# Patient Record
Sex: Female | Born: 1995 | Race: Black or African American | Hispanic: No | Marital: Single | Smoking: Former smoker
Health system: Southern US, Community
[De-identification: ages and names within clinical notes are randomized; demographics above are authoritative.]

## PROBLEM LIST (undated history)

## (undated) DIAGNOSIS — O24419 Gestational diabetes mellitus in pregnancy, unspecified control: Secondary | ICD-10-CM

## (undated) DIAGNOSIS — Z789 Other specified health status: Secondary | ICD-10-CM

## (undated) DIAGNOSIS — F32A Depression, unspecified: Secondary | ICD-10-CM

## (undated) DIAGNOSIS — I10 Essential (primary) hypertension: Secondary | ICD-10-CM

## (undated) HISTORY — DX: Essential (primary) hypertension: I10

## (undated) HISTORY — PX: NO PAST SURGERIES: SHX2092

## (undated) HISTORY — DX: Depression, unspecified: F32.A

---

## 2016-11-15 ENCOUNTER — Other Ambulatory Visit: Payer: Self-pay

## 2017-01-01 ENCOUNTER — Encounter (HOSPITAL_COMMUNITY): Payer: Self-pay

## 2017-01-01 LAB — US OP OB COMP + 14 WK

## 2017-01-03 ENCOUNTER — Other Ambulatory Visit (HOSPITAL_COMMUNITY): Payer: Self-pay | Admitting: Unknown Physician Specialty

## 2017-01-03 DIAGNOSIS — O283 Abnormal ultrasonic finding on antenatal screening of mother: Secondary | ICD-10-CM

## 2017-01-03 DIAGNOSIS — Z3689 Encounter for other specified antenatal screening: Secondary | ICD-10-CM

## 2017-01-03 DIAGNOSIS — Z3A18 18 weeks gestation of pregnancy: Secondary | ICD-10-CM

## 2017-01-09 ENCOUNTER — Encounter (HOSPITAL_COMMUNITY): Payer: Self-pay | Admitting: *Deleted

## 2017-01-10 ENCOUNTER — Other Ambulatory Visit (HOSPITAL_COMMUNITY): Payer: Self-pay | Admitting: Unknown Physician Specialty

## 2017-01-10 ENCOUNTER — Ambulatory Visit (HOSPITAL_COMMUNITY): Admission: RE | Admit: 2017-01-10 | Payer: Medicaid Other | Source: Ambulatory Visit

## 2017-01-10 ENCOUNTER — Ambulatory Visit (HOSPITAL_COMMUNITY)
Admission: RE | Admit: 2017-01-10 | Discharge: 2017-01-10 | Disposition: A | Discharge status: Home / Self Care | Payer: Medicaid Other | Source: Ambulatory Visit | Attending: Unknown Physician Specialty | Admitting: Unknown Physician Specialty

## 2017-01-10 ENCOUNTER — Other Ambulatory Visit (HOSPITAL_COMMUNITY): Payer: Self-pay | Admitting: *Deleted

## 2017-01-10 ENCOUNTER — Encounter (HOSPITAL_COMMUNITY): Payer: Self-pay

## 2017-01-10 DIAGNOSIS — Z3A18 18 weeks gestation of pregnancy: Secondary | ICD-10-CM

## 2017-01-10 DIAGNOSIS — Z3A19 19 weeks gestation of pregnancy: Secondary | ICD-10-CM | POA: Diagnosis not present

## 2017-01-10 DIAGNOSIS — Z363 Encounter for antenatal screening for malformations: Secondary | ICD-10-CM | POA: Diagnosis not present

## 2017-01-10 DIAGNOSIS — O350XX0 Maternal care for (suspected) central nervous system malformation in fetus, not applicable or unspecified: Secondary | ICD-10-CM | POA: Insufficient documentation

## 2017-01-10 DIAGNOSIS — O283 Abnormal ultrasonic finding on antenatal screening of mother: Secondary | ICD-10-CM

## 2017-01-10 DIAGNOSIS — Z3689 Encounter for other specified antenatal screening: Secondary | ICD-10-CM

## 2017-01-10 DIAGNOSIS — O359XX0 Maternal care for (suspected) fetal abnormality and damage, unspecified, not applicable or unspecified: Secondary | ICD-10-CM

## 2017-01-10 HISTORY — DX: Other specified health status: Z78.9

## 2017-01-10 LAB — ROUTINE CHROMOSOME - KARYOTYPE

## 2017-01-10 LAB — CHROMOSOME AFP/ACHE/HBF, AMNIOTIC FLUID

## 2017-01-13 ENCOUNTER — Other Ambulatory Visit: Payer: Self-pay

## 2017-01-13 NOTE — Progress Notes (Signed)
Genetic Counseling  Visit Summary Note  Appointment Date: 2/92018 Referred By: Alyssa Morgan  Date of Birth: July 22, 1996  Pregnancy history: G1P0 Estimated Date of Delivery: 06/03/17 Estimated Gestational Age: [redacted]w[redacted]d I met with [name] for genetic counseling because of abnormal ultrasound findings.  She was accompanied to the visit by her mother and a family friend.  In summary:  Discussed ultrasound findings in detail  Reviewed options for additional screening  NIPS  Echocardiogram  Ongoing ultrasound  Reviewed options for diagnostic testing, including risks, benefits, limitations and alternatives  Amniocentesis performed   FISH, karyotype and microarray requested  Discussed option of continuation vs. Termination of pregnancy  [name] was sent for ultrasound and consultation today regarding abnormal ultrasound findings.  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 There were limited views of the fetal face and fetal heart. We discussed these findings in detail.  Specifically, we discussed that congenital anomalies can occur as isolated, nonsyndromic birth defects, or as features of an underlying genetic syndrome.  The risk for a genetic etiology increases with the presence of multiple fetal anatomic differences.  Based on these ultrasound findings, the risk for fetal aneuploidy is estimated to be 20-30%.  We reviewed chromosomes, nondisjunction, and the common features of Down syndrome, trisomy 132 and trisomy 125  In addition, we discussed the risk for other chromosome aberrations including microdeletions (22q11 del syndrome), duplications, insertions, and translocations.  We discussed that holoprosencephaly (HPE) is a structural anomaly of the brain in which there is failed or incomplete separation of the forebrain during the third to fourth week of pregnancy.  Classic HPE encompasses a continuum of brain malformations including: alobar (no separation of the cerebral hemispheres), semilobar (left and right frontal and parietal lobes are fused and the interhemispheric fissure is only present posteriorly), and lobar (most of the cerebral hemispheres are separated but the most rostral aspect remains fused).  HPE is accompanied by a spectrum of characteristic craniofacial anomalies in approximately 80% of individuals with HPE, including cyclopia or ocular hypotelorism, bilateral cleft lip/palate, and abnormal nose or proboscis.  We discussed that most children with HPE have a variety of health problems which may include: feeding difficulties, seizures, mental retardation/developmental delay, pituitary dysfunction, short stature, sleep disorders, and aspiration pneumonia.  We discussed that the prognosis for the child depends largely upon the underlying etiology and the severity of the HPE.  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  We discussed that it is difficult to fully predict the lifespan of the child, but based on the current findings, the prognosis appears poor.  A recent publication found that approximately 50% of children with alobar HPE pass away before age f34to five months and only 20% live beyond the first year of life.  We also discussed the various causes for HPE including: an environmental (alcohol and maternal diabetes mellitus), multifactorial, or genetic etiology. Regarding genetic etiologies, we discussed that these can be further subdivided into syndromic and nonsyndromic categories.  Syndromic causes include many single gene and chromosomal aberrations.  The single gene category includes both dominant (Pallister-Hall) and recessive conditions (Meckel syndrome and SLOS), while the chromosome differences include a variety of deletions, duplications, trisomies, and triploidy.  We reviewed that the  nonsyndromic single gene causes of HPE are mostly inherited in an autosomal dominant fashion.  More than 12 genes are currently known  to cause nonsyndromic HPE.  We discussed that these gene mutations are associated with phenotypic variability.  She was counseled that the risk of recurrence depends upon the underlying etiology.     We discussed that approximately 25-50% of fetuses with HPE have a chromosome difference (specifically trisomy 28).  She was counseled regarding the option of amniocentesis  including the associated risks, benefits, and limitations.  She understands that chromosome analysis can be performed both prenatally (amniotic fluid) and postnatally (peripheral blood or cord blood).  Additionally, we discussed the availability of microarray analysis, which can also be performed pre and postnatally.  [name] was counseled that microarray analysis is a molecular based technique in which a test sample of DNA (fetal) is compared to a reference (normal) genome in order to determine if the test sample has any extra or missing genetic information.  Microarray analysis allows for the detection of genetic deletions and duplications that are 818 times smaller than those identified by routine chromosome analysis.  We discussed that recent publications show that approximately 6% of patients with an abnormal fetal ultrasound and a normal fetal karyotype had a significant microdeletion/microduplication detected by prenatal microarray analysis. She was also counseled regarding the option of noninvasive prenatal screening (NIPS).  We reviewed that this technology evaluates fragments of fetal (placental) DNA found in maternal circulation to predict the chance of specific chromosome conditions in the fetus.  Although highly specific and sensitive, this testing is not considered diagnostic.  We reviewed that NIPS specifically assesses the risk of fetal Down syndrome, trisomy 31, trisomy 74, fetal sex chromosome  aneuploidy, triploidy, and select microdeletions (22q11 deletion syndrome); currently, this technology cannot assess the risk for all chromosome aberrations or single gene conditions.   We also reviewed the availability of a fetal MRI versus postnatal MRI to help determine the severity of the HPE.  [name] elected to have amniocentesis following our discussion today, routine chromosome analysis, FISH and microarray were ordered.  We also discussed the option of continuing the pregnancy versu termination of pregnancy.  We discussed that pregnancy termination is a legal option in [place] until [redacted] weeks gestation, following a 72 hour waiting period after consents are signed.  We discussed the options of D&E and induction.  [name] was counseled that if she elects to continue the pregnancy we will be available to assist her in any way we can to gather more information regarding the etiology and identify a plan for her baby's delivery and newborn management.    Both family histories were briefly reviewed and found to be noncontributory for birth defects, mental retardation, and known genetic conditions. Without further information regarding the provided family history, an accurate genetic risk cannot be calculated. Further genetic counseling is warranted if more information is obtained.  [name] denied exposure to environmental toxins or chemical agents. She denied the use of alcohol, tobacco or street drugs. She denied significant viral illnesses during the course of her pregnancy. Her medical and surgical histories were noncontributory.   I counseled [name] regarding the above risks and available options.  The approximate face-to-face time with the genetic counselor was 65 minutes.  Cam Hai, MS,  Certified Genetic Counselor

## 2017-01-14 ENCOUNTER — Other Ambulatory Visit (HOSPITAL_COMMUNITY): Payer: Self-pay | Admitting: *Deleted

## 2017-01-14 ENCOUNTER — Telehealth (HOSPITAL_COMMUNITY): Payer: Self-pay | Admitting: Genetics

## 2017-01-14 DIAGNOSIS — Q042 Holoprosencephaly: Secondary | ICD-10-CM

## 2017-01-14 DIAGNOSIS — O283 Abnormal ultrasonic finding on antenatal screening of mother: Secondary | ICD-10-CM | POA: Insufficient documentation

## 2017-01-14 DIAGNOSIS — Z3A19 19 weeks gestation of pregnancy: Secondary | ICD-10-CM | POA: Insufficient documentation

## 2017-01-14 NOTE — Telephone Encounter (Signed)
Called Alyssa Morgan to discuss the preliminary FISH results from her amniocentesis. Patient verified by name and date of birth. We reviewed that these are within normal limits.  We again discussed the limitations of FISH and that final results are still pending and will be available in 1-2 weeks.  Patient reported that she has not noticed any concerning symptoms or complications following the procedure.  All questions were answered to her satisfaction, she was encouraged to call with additional questions or concerns.  Ms. Lattie CornsMcNebb then put her mother on the phone.  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  She was wanting to know when they needed to sign the paperwork as they were going to their physician's office in [place] today to sign a release to send the referral.  We discussed that Alyssa would need to speak with a physician regarding the 72 hour papers and that I would discuss with Dr. Claudean SeveranceWhitecar and Dr. Otho PerlNitsche and we would contact her.  Mady Gemmaaragh Meklit Cotta, MS Certified Genetic Counselor

## 2017-01-16 ENCOUNTER — Other Ambulatory Visit: Payer: Self-pay

## 2017-01-20 ENCOUNTER — Other Ambulatory Visit: Payer: Self-pay

## 2017-01-27 ENCOUNTER — Other Ambulatory Visit: Payer: Self-pay

## 2017-02-06 ENCOUNTER — Encounter (HOSPITAL_COMMUNITY): Payer: Self-pay | Admitting: *Deleted

## 2017-02-07 ENCOUNTER — Ambulatory Visit (HOSPITAL_COMMUNITY)
Admission: RE | Admit: 2017-02-07 | Discharge: 2017-02-07 | Disposition: A | Discharge status: Home / Self Care | Payer: Medicaid Other | Source: Ambulatory Visit | Attending: Unknown Physician Specialty | Admitting: Unknown Physician Specialty

## 2018-02-19 ENCOUNTER — Other Ambulatory Visit (HOSPITAL_COMMUNITY): Payer: Self-pay | Admitting: Unknown Physician Specialty

## 2018-02-19 DIAGNOSIS — Z3689 Encounter for other specified antenatal screening: Secondary | ICD-10-CM

## 2018-02-24 ENCOUNTER — Encounter (HOSPITAL_COMMUNITY): Payer: Self-pay | Admitting: *Deleted

## 2018-02-25 ENCOUNTER — Ambulatory Visit (HOSPITAL_COMMUNITY)
Admission: RE | Admit: 2018-02-25 | Discharge: 2018-02-25 | Disposition: A | Discharge status: Home / Self Care | Payer: Medicaid Other | Source: Ambulatory Visit | Attending: Unknown Physician Specialty | Admitting: Unknown Physician Specialty

## 2018-02-25 ENCOUNTER — Encounter (HOSPITAL_COMMUNITY): Payer: Self-pay

## 2018-02-25 ENCOUNTER — Other Ambulatory Visit (HOSPITAL_COMMUNITY): Payer: Self-pay | Admitting: Unknown Physician Specialty

## 2018-02-25 DIAGNOSIS — O09892 Supervision of other high risk pregnancies, second trimester: Secondary | ICD-10-CM | POA: Insufficient documentation

## 2018-02-25 DIAGNOSIS — O352XX0 Maternal care for (suspected) hereditary disease in fetus, not applicable or unspecified: Secondary | ICD-10-CM | POA: Insufficient documentation

## 2018-02-25 DIAGNOSIS — Z3689 Encounter for other specified antenatal screening: Secondary | ICD-10-CM | POA: Insufficient documentation

## 2018-02-25 DIAGNOSIS — E669 Obesity, unspecified: Secondary | ICD-10-CM | POA: Insufficient documentation

## 2018-02-25 DIAGNOSIS — Z8279 Family history of other congenital malformations, deformations and chromosomal abnormalities: Secondary | ICD-10-CM | POA: Insufficient documentation

## 2018-02-25 DIAGNOSIS — Z3A18 18 weeks gestation of pregnancy: Secondary | ICD-10-CM | POA: Insufficient documentation

## 2018-02-25 DIAGNOSIS — O99212 Obesity complicating pregnancy, second trimester: Secondary | ICD-10-CM | POA: Insufficient documentation

## 2018-02-26 ENCOUNTER — Other Ambulatory Visit (HOSPITAL_COMMUNITY): Payer: Self-pay | Admitting: *Deleted

## 2018-02-26 ENCOUNTER — Encounter (HOSPITAL_COMMUNITY): Payer: Self-pay

## 2018-02-26 DIAGNOSIS — Z362 Encounter for other antenatal screening follow-up: Secondary | ICD-10-CM

## 2018-03-25 ENCOUNTER — Encounter (HOSPITAL_COMMUNITY): Payer: Self-pay

## 2018-03-25 ENCOUNTER — Other Ambulatory Visit (HOSPITAL_COMMUNITY): Payer: Self-pay | Admitting: Obstetrics and Gynecology

## 2018-03-25 ENCOUNTER — Ambulatory Visit (HOSPITAL_COMMUNITY)
Admission: RE | Admit: 2018-03-25 | Discharge: 2018-03-25 | Disposition: A | Discharge status: Home / Self Care | Payer: Medicaid Other | Source: Ambulatory Visit | Attending: Unknown Physician Specialty | Admitting: Unknown Physician Specialty

## 2018-03-25 DIAGNOSIS — Z362 Encounter for other antenatal screening follow-up: Secondary | ICD-10-CM | POA: Insufficient documentation

## 2018-03-25 DIAGNOSIS — Z3A22 22 weeks gestation of pregnancy: Secondary | ICD-10-CM

## 2018-03-25 DIAGNOSIS — O99212 Obesity complicating pregnancy, second trimester: Secondary | ICD-10-CM | POA: Insufficient documentation

## 2018-03-25 DIAGNOSIS — O99210 Obesity complicating pregnancy, unspecified trimester: Secondary | ICD-10-CM | POA: Insufficient documentation

## 2018-03-25 DIAGNOSIS — O09892 Supervision of other high risk pregnancies, second trimester: Secondary | ICD-10-CM

## 2018-03-26 ENCOUNTER — Other Ambulatory Visit (HOSPITAL_COMMUNITY): Payer: Self-pay | Admitting: *Deleted

## 2018-03-26 DIAGNOSIS — O99210 Obesity complicating pregnancy, unspecified trimester: Secondary | ICD-10-CM

## 2018-05-06 ENCOUNTER — Encounter (HOSPITAL_COMMUNITY): Payer: Self-pay

## 2018-05-06 ENCOUNTER — Ambulatory Visit (HOSPITAL_COMMUNITY)
Admission: RE | Admit: 2018-05-06 | Discharge: 2018-05-06 | Disposition: A | Discharge status: Home / Self Care | Payer: Medicaid Other | Source: Ambulatory Visit | Attending: Unknown Physician Specialty | Admitting: Unknown Physician Specialty

## 2018-05-06 DIAGNOSIS — O99213 Obesity complicating pregnancy, third trimester: Secondary | ICD-10-CM | POA: Insufficient documentation

## 2018-05-06 DIAGNOSIS — Z362 Encounter for other antenatal screening follow-up: Secondary | ICD-10-CM | POA: Insufficient documentation

## 2018-05-06 DIAGNOSIS — Z8489 Family history of other specified conditions: Secondary | ICD-10-CM | POA: Diagnosis not present

## 2018-05-06 DIAGNOSIS — O352XX0 Maternal care for (suspected) hereditary disease in fetus, not applicable or unspecified: Secondary | ICD-10-CM | POA: Insufficient documentation

## 2018-05-06 DIAGNOSIS — Z3A28 28 weeks gestation of pregnancy: Secondary | ICD-10-CM | POA: Diagnosis present

## 2018-05-06 DIAGNOSIS — E669 Obesity, unspecified: Secondary | ICD-10-CM | POA: Diagnosis not present

## 2018-05-06 DIAGNOSIS — O09893 Supervision of other high risk pregnancies, third trimester: Secondary | ICD-10-CM | POA: Diagnosis not present

## 2018-05-06 DIAGNOSIS — O99210 Obesity complicating pregnancy, unspecified trimester: Secondary | ICD-10-CM

## 2018-05-23 IMAGING — US US MFM OB DETAIL+14 WK
1 series · 13 of 28 positions shown · non-contrast
Comparison: none

[Series 1: us mfm ob detail+14 wk · 72 acquisitions, 13 frames shown]
[im 3/72]
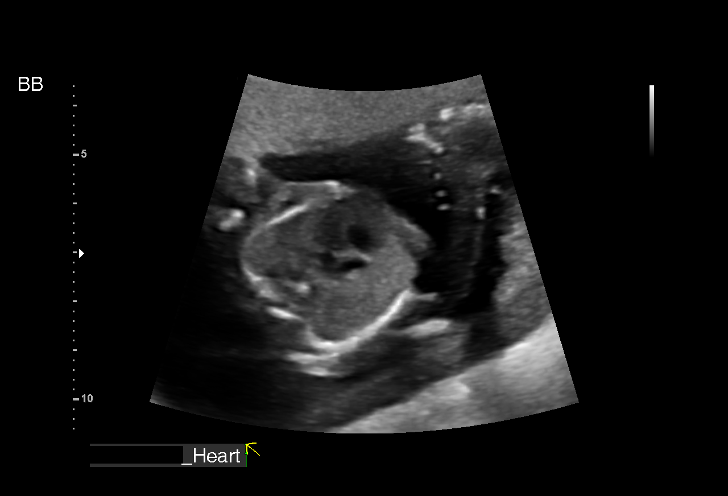
[im 8/72]
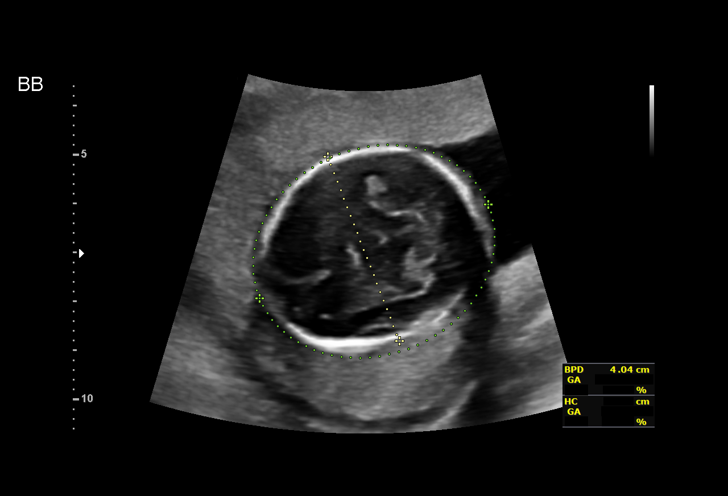
[im 14/72]
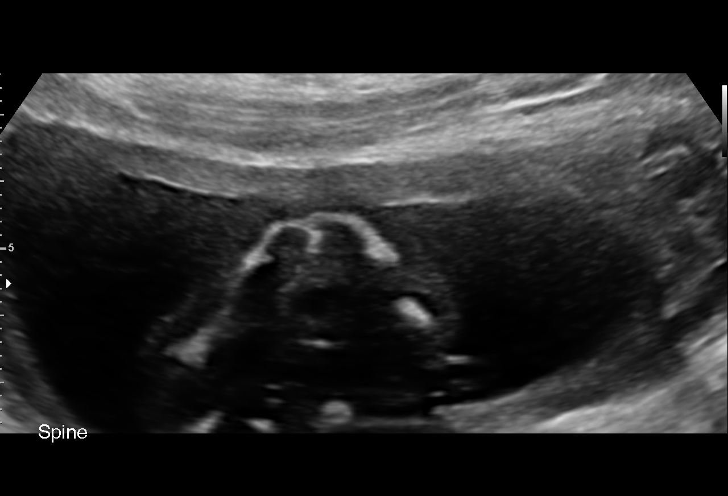
[im 19/72]
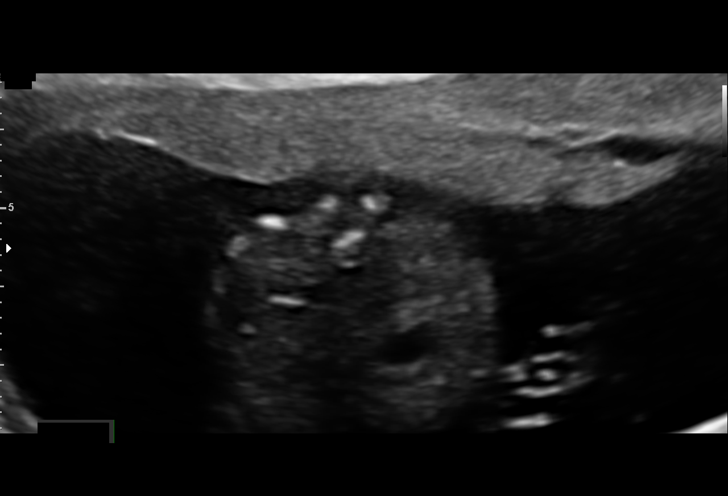
[im 24/72]
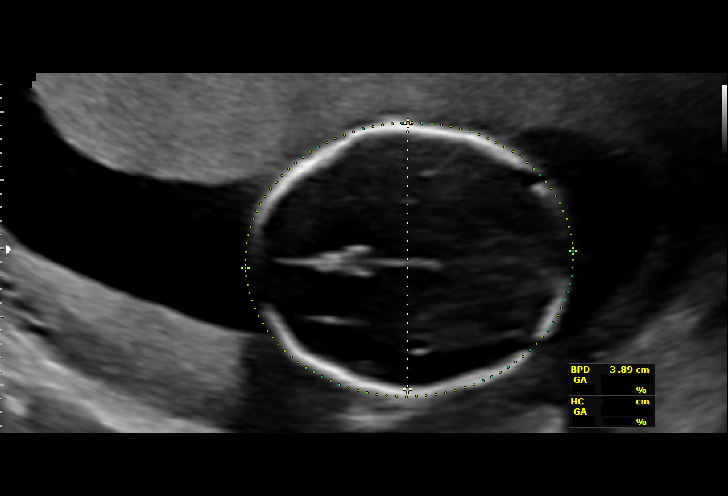
[im 29/72]
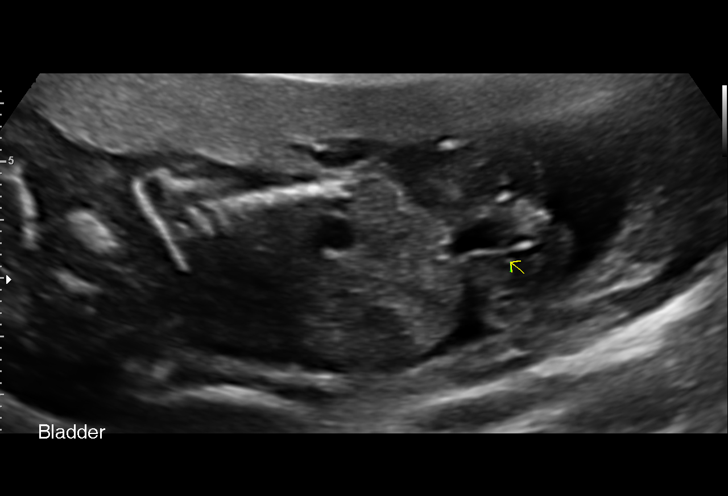
[im 37/72]
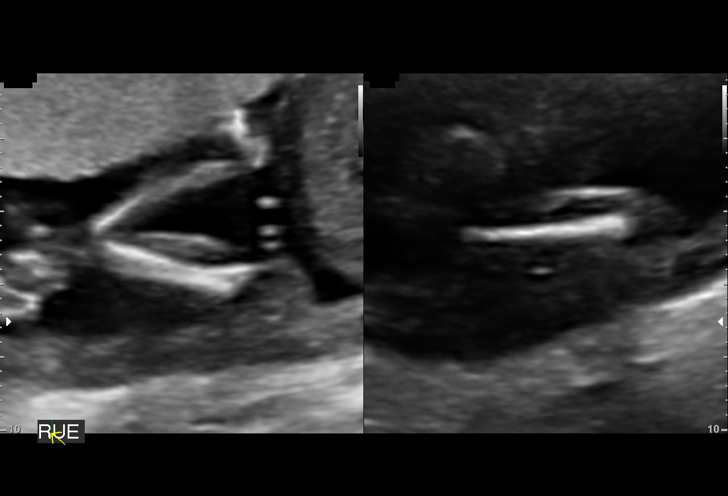
[im 43/72]
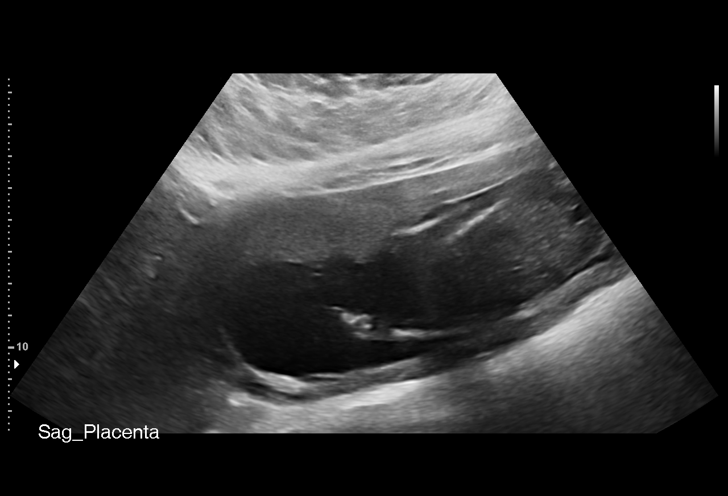
[im 48/72]
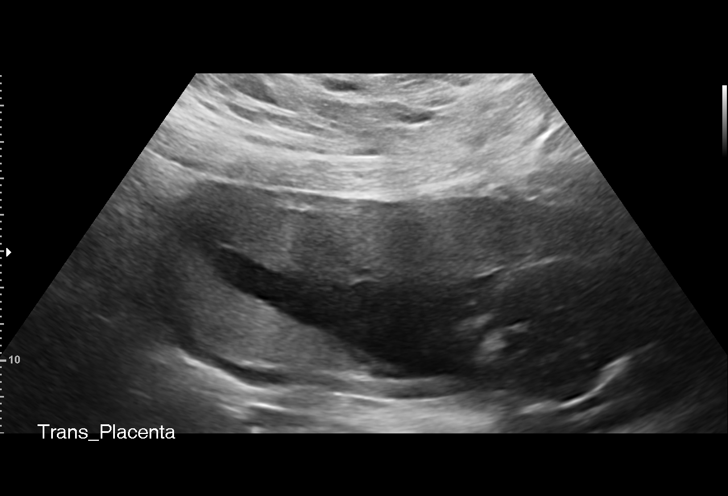
[im 53/72]
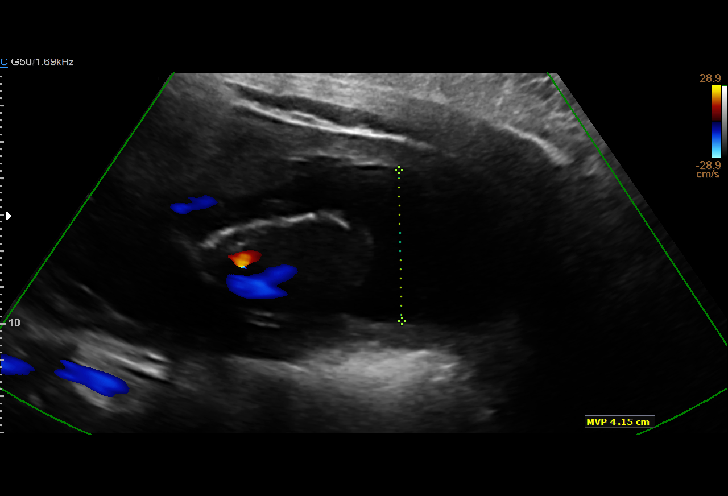
[im 58/72]
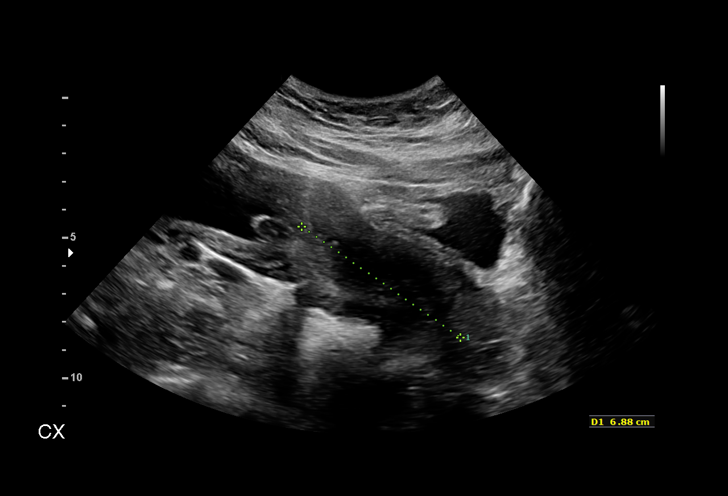
[im 64/72]
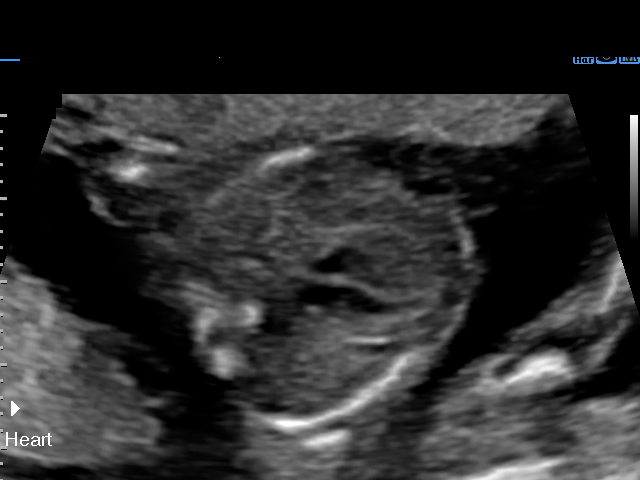
[im 69/72]
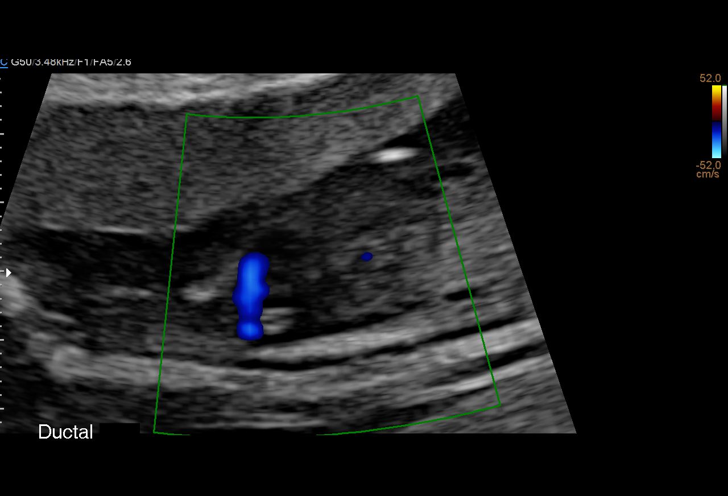

[13 of 28 positions shown; findings below may reference images not displayed]

Cent[facility]
[address],

[address]  PAULO RUI BOA              [id number]      [id number]     [id number]
Indications

18 weeks gestation of pregnancy
Encounter for fetal anatomic survey
Previous child with congenital anomaly,
antepartum (semi-lobar holoprosencephaly
Obesity complicating pregnancy, second
trimester
Short interval between pregancies, 2nd
trimester
OB History

Blood Type:            Height:         Weight (lb):  264       BMI:
Gravidity:    2         Prem:   0         SAB:   0
TOP:          1       Ectopic:  0        Living: 0
Fetal Evaluation

Num Of Fetuses:     1
Fetal Heart         148
Rate(bpm):
Cardiac Activity:   Observed
Presentation:       Variable
Placenta:           Right lateral, above cervical os
P. Cord Insertion:  Visualized

Amniotic Fluid
AFI FV:      Subjectively within normal limits

Largest Pocket(cm)
4.15
Biometry

BPD:      40.4  mm     G. Age:  18w 2d         63  %    CI:        77.38   %    70 - 86
FL/HC:      15.3   %    15.8 - 18
HC:      145.4  mm     G. Age:  17w 5d         29  %    HC/AC:      1.15        1.07 -
AC:      126.4  mm     G. Age:  18w 2d         55  %    FL/BPD:     55.2   %
FL:       22.3  mm     G. Age:  16w 5d          8  %    FL/AC:      17.6   %    20 - 24

Est. FW:     199  gm      0 lb 7 oz     39  %
Gestational Age

LMP:           18w 6d        Date:  10/16/17                 EDD:   07/23/18
U/S Today:     17w 5d                                        EDD:   07/31/18
Best:          18w 0d     Det. By:  Previous Ultrasound      EDD:   07/29/18
(02/18/18)
Anatomy

Cranium:               Appears normal         Aortic Arch:            Not well visualized
Cavum:                 Appears normal         Ductal Arch:            Appears normal
Ventricles:            Appears normal         Diaphragm:              Appears normal
Choroid Plexus:        Appears normal         Stomach:                Appears normal, left
sided
Cerebellum:            Appears normal         Abdomen:                Appears normal
Posterior Fossa:       Appears normal         Abdominal Wall:         Appears nml (cord
insert, abd wall)
Nuchal Fold:           Appears normal         Cord Vessels:           Appears normal (3
vessel cord)
Face:                  Orbits nl; profile not Kidneys:                Appear normal
well visualized
Lips:                  Appears normal         Bladder:                Appears normal
Thoracic:              Appears normal         Spine:                  Appears normal
Heart:                 Appears normal         Upper Extremities:      Appears normal
(4CH, axis, and
situs)
RVOT:                  Not well visualized    Lower Extremities:      Appears normal
LVOT:                  Appears normal

Other:  Fetus appears to be a female. Nasal bone visualized. Heels
visualized. Technically difficult due to maternal habitus and fetal
position.
Cervix Uterus Adnexa

Cervix
Normal appearance by transabdominal scan.

Uterus
No abnormality visualized.

Left Ovary
Not visualized.

Right Ovary
Not visualized.

Adnexa:       No abnormality visualized.
Impression

Singleton intrauterine pregnancy at 18+0 weeks with a
previous child with holoprosencephaly here for anatomic
survey
Review of the anatomy shows no sonographic markers for
aneuploidy or structural anomalies
However, views of the fetal heart should be considered
suboptimal secondary to maternal body habitus and fetal
position
Amniotic fluid volume is normal
Estimated fetal weight shows growth in the 39th percentile
Recommendations

Recommend follow-up ultrasound examination in 4 weeks for
completion of the anatomic survey
Given the normal findings today and the complete genetic
workup performed in the last pregnancy (including
microarray), the patient does not really need repeat genetic
counseling. This wasdiscussed with the patient and she
agreed to forgo genetic counseling today

## 2018-07-22 DIAGNOSIS — O24419 Gestational diabetes mellitus in pregnancy, unspecified control: Secondary | ICD-10-CM

## 2018-08-18 IMAGING — US US MFM OB FOLLOW-UP
1 series · 14 of 28 positions shown · non-contrast
Comparison: none

[Series 1: us mfm ob follow-up · 39 acquisitions, 14 frames shown]
[im 2/39]
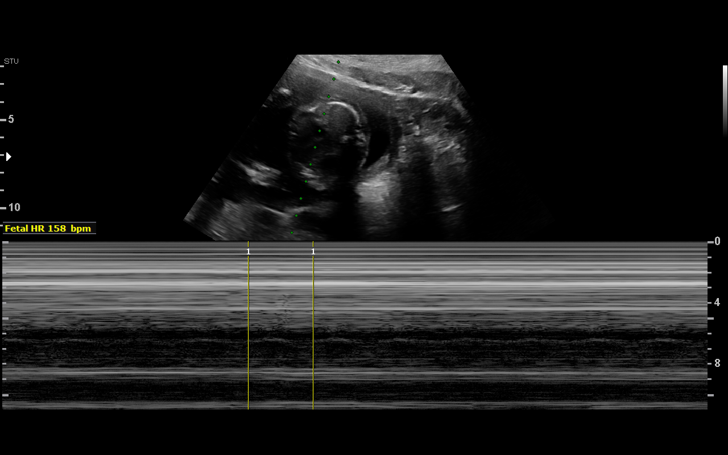
[im 5/39]
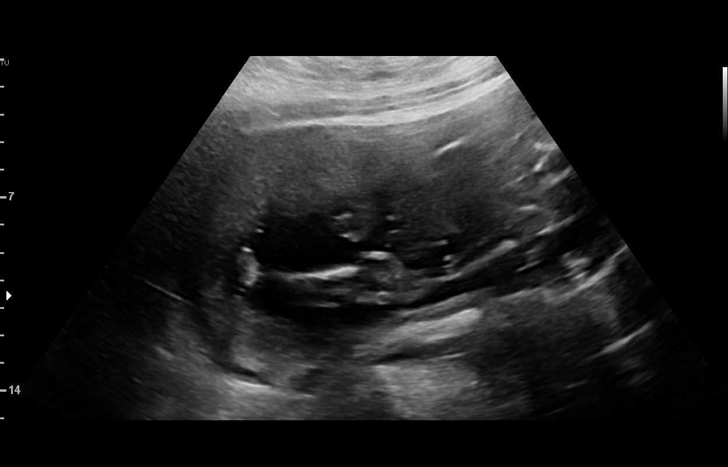
[im 8/39]
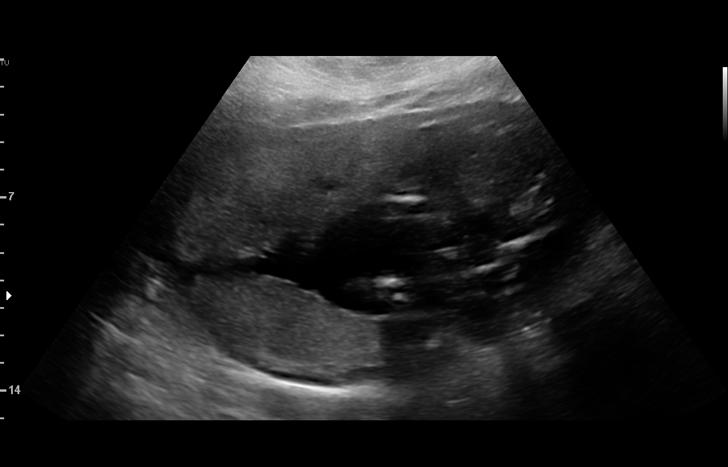
[im 10/39]
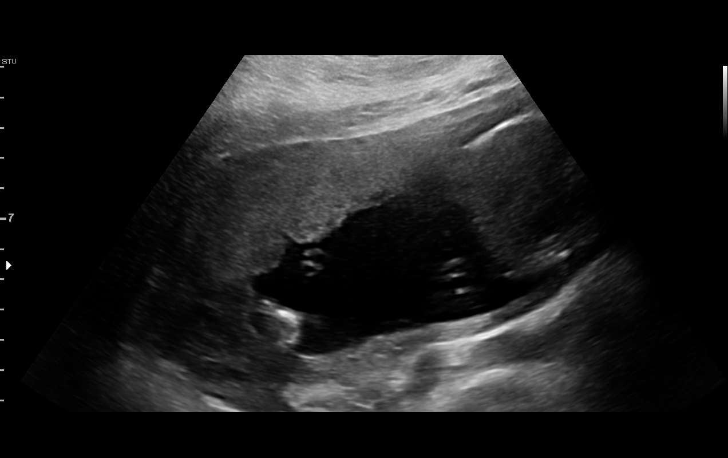
[im 13/39]
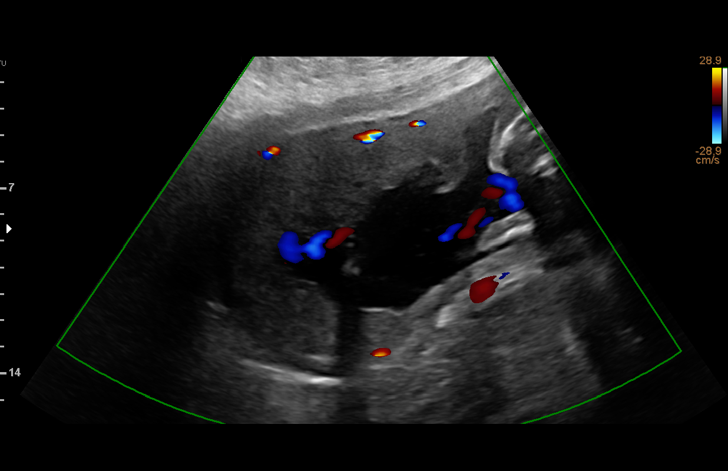
[im 16/39]
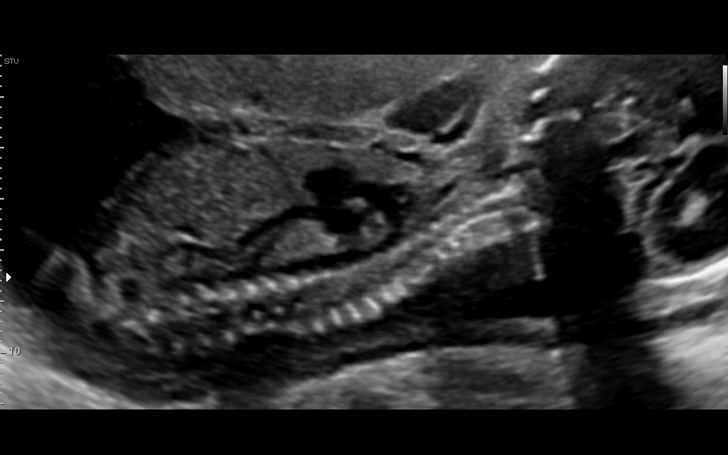
[im 19/39]
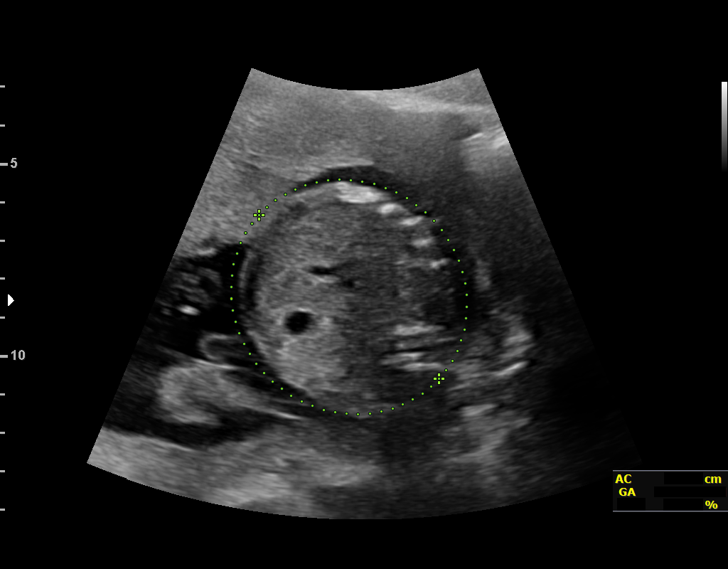
[im 22/39]
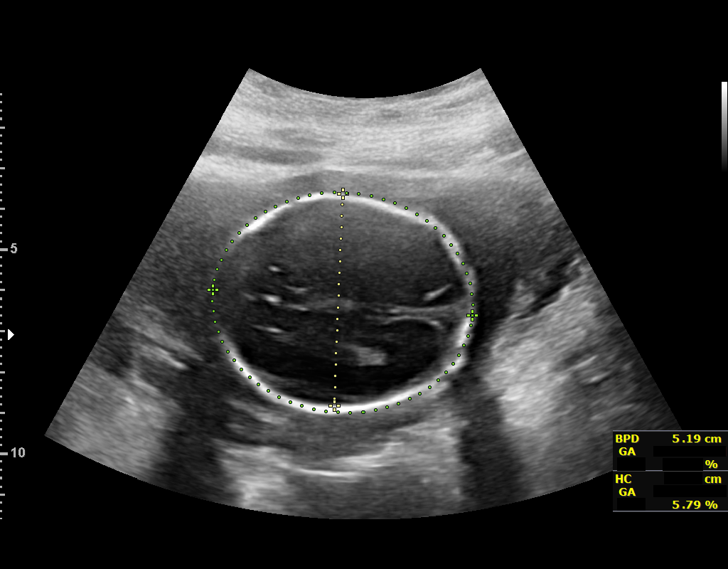
[im 24/39]
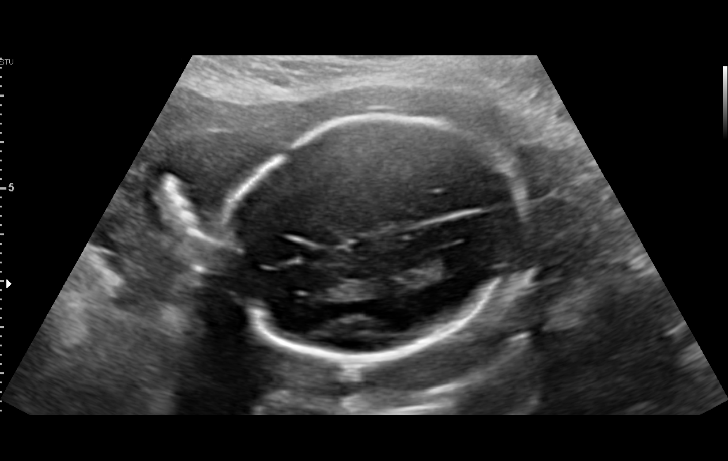
[im 27/39]
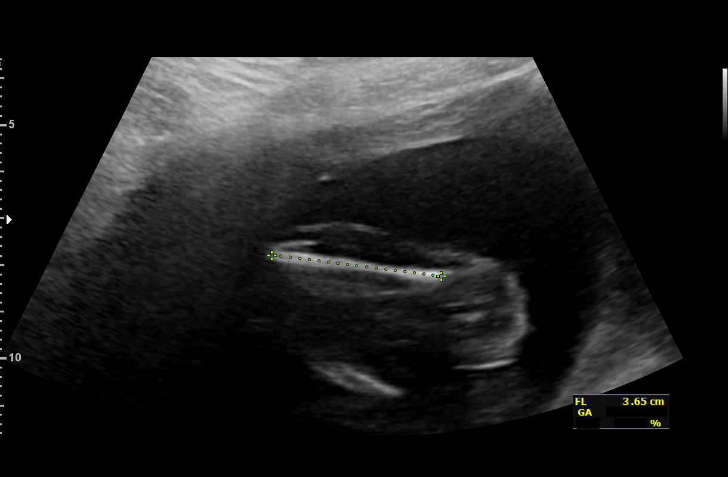
[im 30/39]
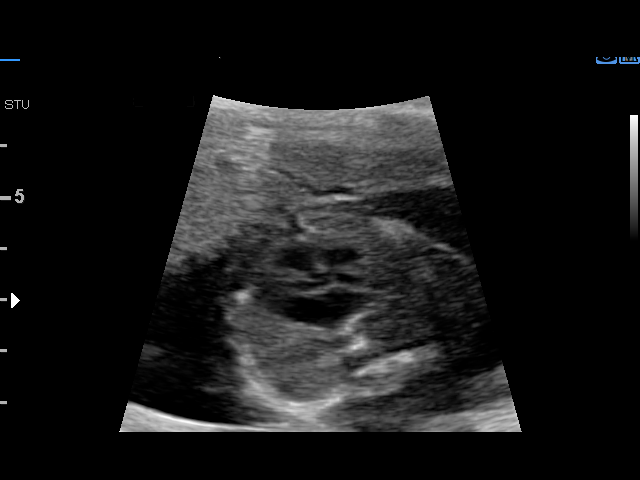
[im 33/39]
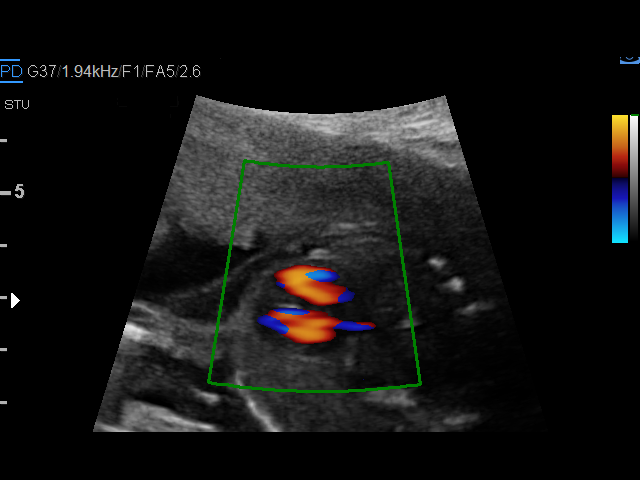
[im 36/39]
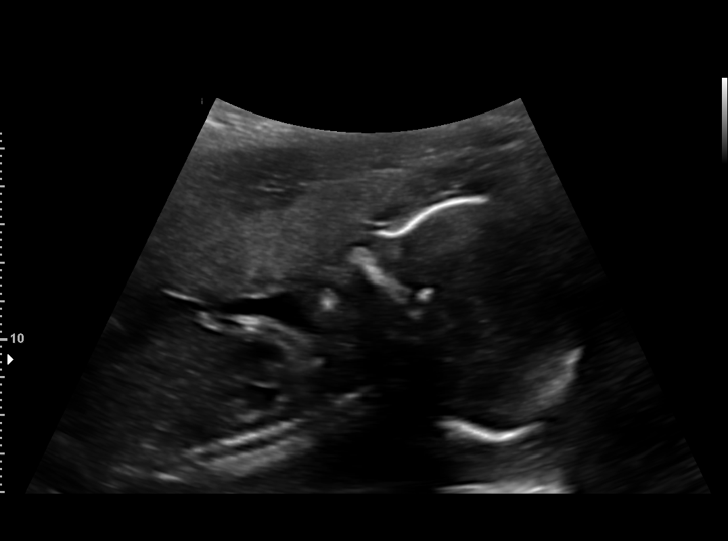
[im 39/39]
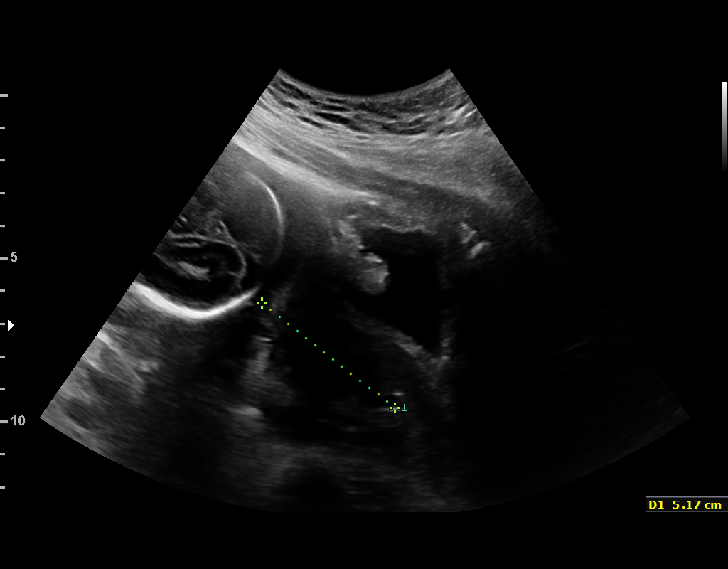

[14 of 28 positions shown; findings below may reference images not displayed]

[facility]
[address],

1  SUGIYONO ASRAFUL              [id number]      [phone]     [phone]
Indications

22 weeks gestation of pregnancy
Previous child with congenital anomaly,
antepartum (semi-lobar holoprosencephaly
Obesity complicating pregnancy, second
trimester
Short interval between pregancies, 2nd
trimester
OB History

Blood Type:            Height:         Weight (lb):  264       BMI:
Gravidity:    2         Prem:   0         SAB:   0
TOP:          1       Ectopic:  0        Living: 0
Fetal Evaluation

Num Of Fetuses:     1
Fetal Heart         158
Rate(bpm):
Cardiac Activity:   Observed
Presentation:       Cephalic
Placenta:           Right lateral, above cervical os
P. Cord Insertion:  Previously Visualized

Amniotic Fluid
AFI FV:      Subjectively within normal limits

Largest Pocket(cm)
5.07
Biometry

BPD:      52.2  mm     G. Age:  21w 6d         41  %    CI:        79.61   %    70 - 86
FL/HC:      19.4   %    18.4 -
HC:      184.9  mm     G. Age:  20w 6d          6  %    HC/AC:      0.97        1.06 -
AC:      191.5  mm     G. Age:  23w 6d         91  %    FL/BPD:     68.8   %    71 - 87
FL:       35.9  mm     G. Age:  21w 3d         21  %    FL/AC:      18.7   %    20 - 24
HUM:      35.6  mm     G. Age:  22w 3d         57  %

Est. FW:     513  gm      1 lb 2 oz     56  %
Gestational Age

LMP:           22w 6d        Date:  10/16/17                 EDD:   07/23/18
U/S Today:     22w 0d                                        EDD:   07/29/18
Best:          22w 0d     Det. By:  Previous Ultrasound      EDD:   07/29/18
(02/18/18)
Anatomy

Cranium:               Appears normal         Aortic Arch:            Appears normal
Cavum:                 Appears normal         Ductal Arch:            Previously seen
Ventricles:            Appears normal         Diaphragm:              Appears normal
Choroid Plexus:        Previously seen        Stomach:                Appears normal, left
sided
Cerebellum:            Previously seen        Abdomen:                Appears normal
Posterior Fossa:       Previously seen        Abdominal Wall:         Previously seen
Nuchal Fold:           Previously seen        Cord Vessels:           Previously seen
Face:                  Profile nl; orbits     Kidneys:                Previously seen
prev seen
Lips:                  Previously seen        Bladder:                Appears normal
Thoracic:              Appears normal         Spine:                  Previously seen
Heart:                 Previously seen        Upper Extremities:      Previously seen
RVOT:                  Not well visualized    Lower Extremities:      Previously seen
LVOT:                  Previously seen

Other:  Fetus appears to be a female. Nasal bone visualized. Heels
visualized. Technically difficult due to maternal habitus and fetal
position.
Cervix Uterus Adnexa

Cervix
Length:           5.17  cm.
Normal appearance by transabdominal scan.
Impression

Single living intrauterine pregnancy at 22w 0d.
Cephalic presentation.
Placenta Right lateral, above cervical os.
Normal amniotic fluid volume.
Appropriate interval fetal growth.
Normal interval fetal anatomy.
Survey remains incomplete as above (RVOT is suboptimal)
Recommendations

Recommend follow up attempt to complete survey in 6 weeks.

## 2018-09-29 IMAGING — US US MFM OB FOLLOW-UP
1 series · 14 of 26 positions shown · non-contrast
Comparison: none

[Series 1: us mfm ob follow-up · 26 acquisitions, 14 frames shown]
[im 1/26]
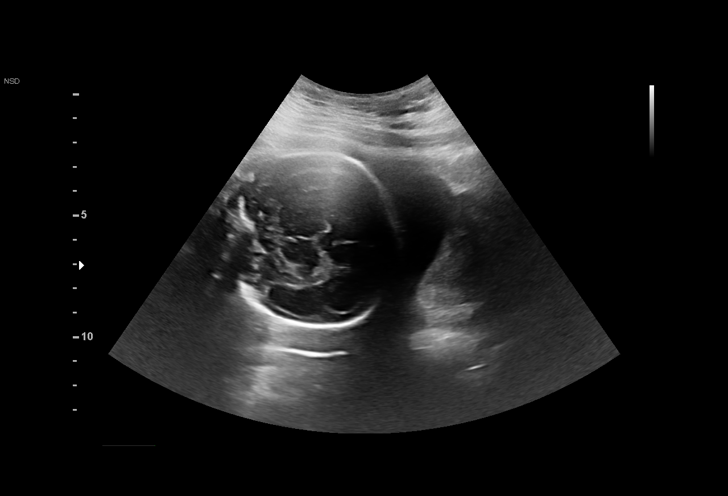
[im 3/26]
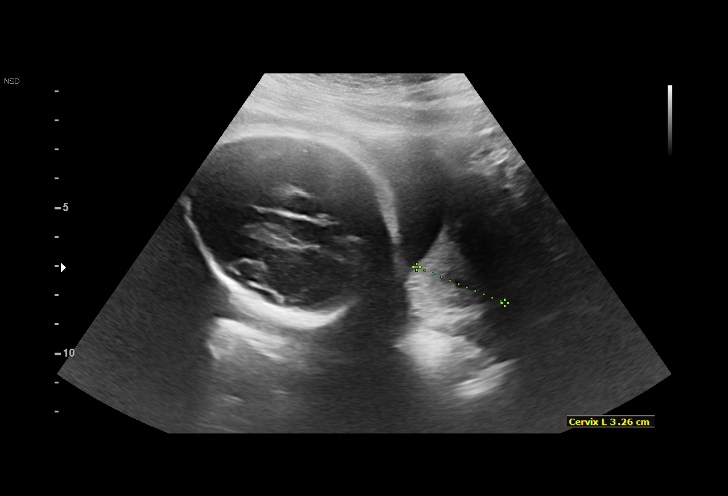
[im 5/26]
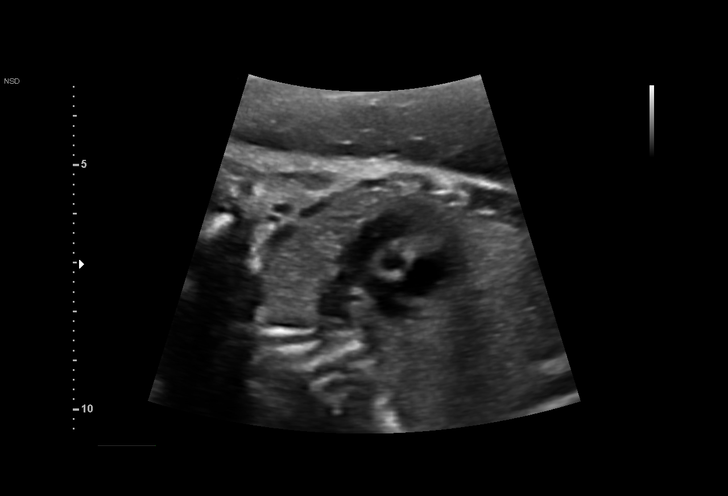
[im 7/26]
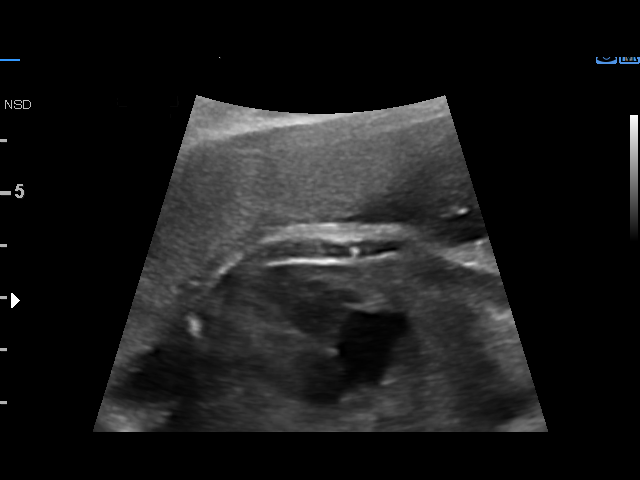
[im 9/26]
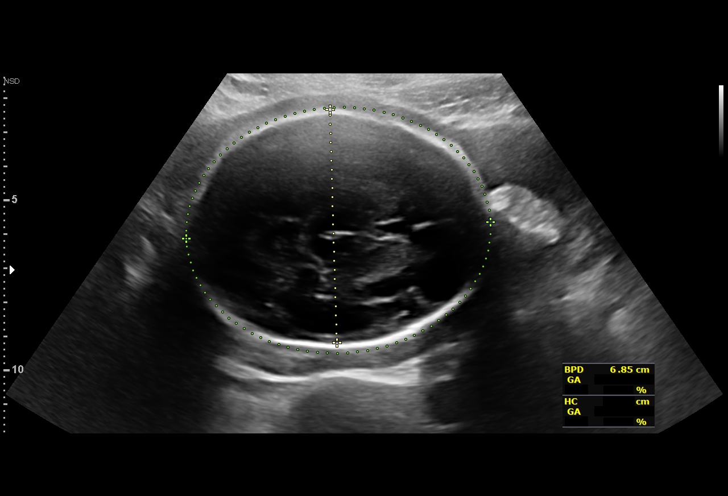
[im 11/26]
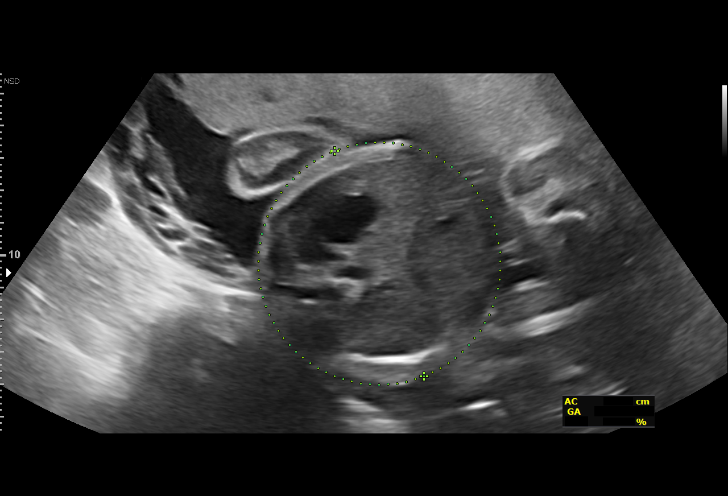
[im 13/26]
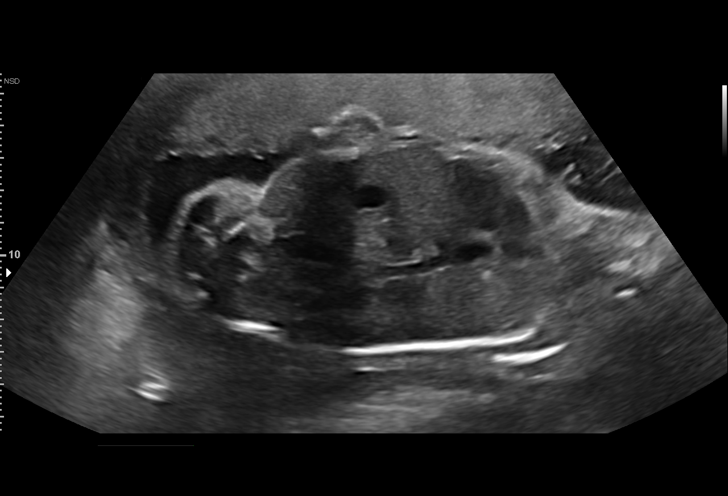
[im 14/26]
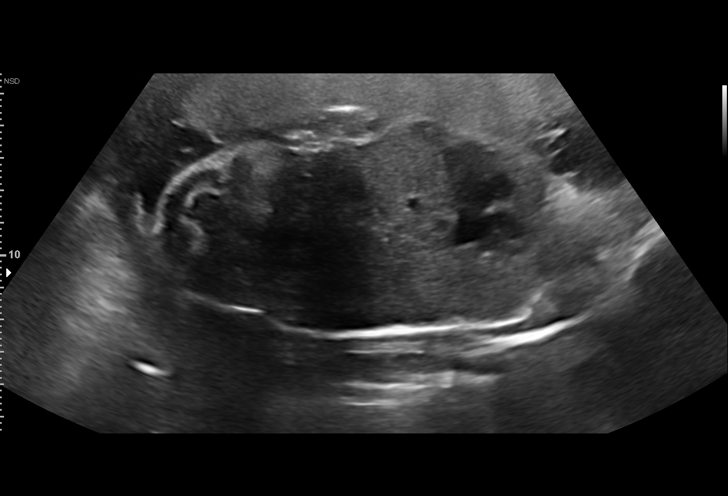
[im 16/26]
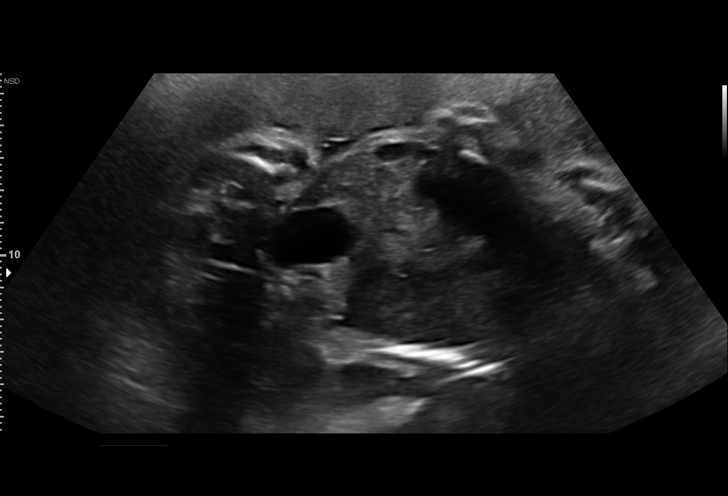
[im 18/26]
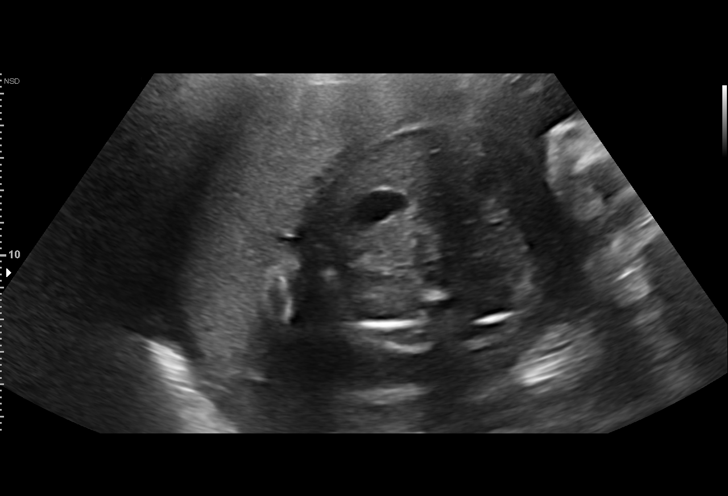
[im 20/26]
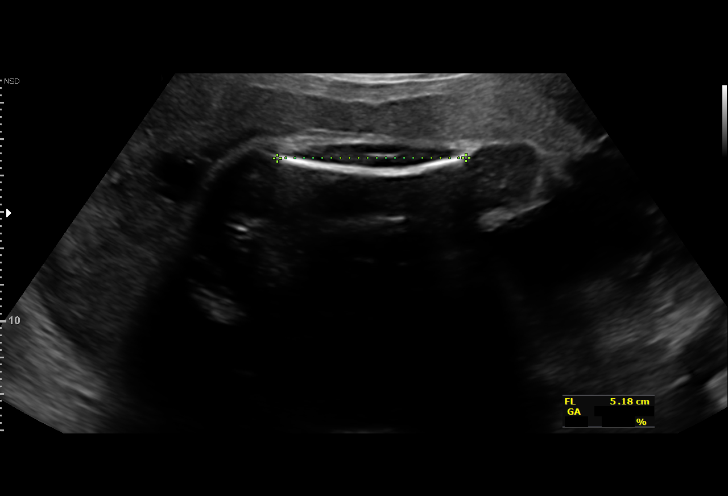
[im 22/26]
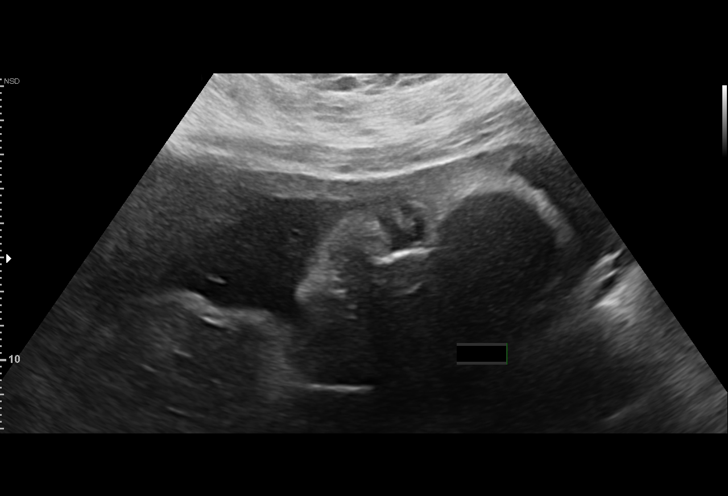
[im 24/26]
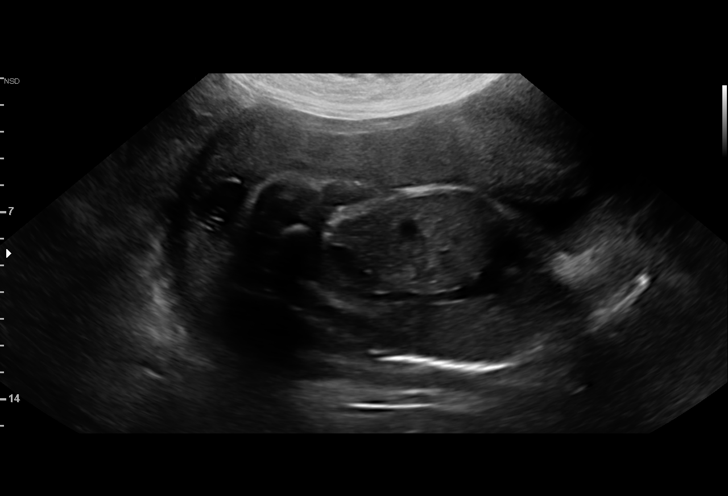
[im 26/26]
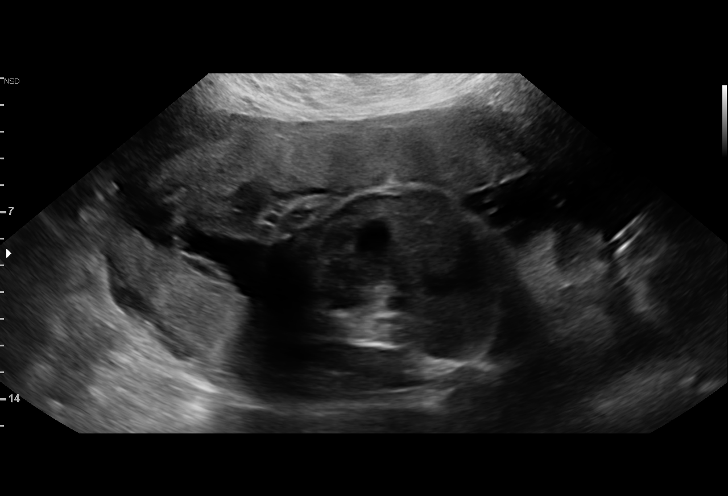

[14 of 26 positions shown; findings below may reference images not displayed]

Center
[REDACTED] [id number]

Indications

28 weeks gestation of pregnancy
Previous child with congenital anomaly,
antepartum (semi-lobar holoprosencephaly
Obesity complicating pregnancy, second
trimester
Short interval between pregancies, 2nd
trimester
Antenatal follow-up for nonvisualized fetal
anatomy
OB History

Blood Type:            Height:         Weight (lb):  264       BMI:
Gravidity:    2         Prem:   0         SAB:   0
TOP:          1       Ectopic:  0        Living: 0
Fetal Evaluation

Num Of Fetuses:     1
Fetal Heart         145
Rate(bpm):
Cardiac Activity:   Observed
Presentation:       Cephalic
Placenta:           Right lateral, above cervical os
P. Cord Insertion:  Previously Visualized

Amniotic Fluid
AFI FV:      Subjectively within normal limits

AFI Sum(cm)     %Tile       Largest Pocket(cm)
14.67           50
RUQ(cm)       RLQ(cm)       LUQ(cm)        LLQ(cm)
3.1
Biometry

BPD:      69.5  mm     G. Age:  27w 6d         36  %    CI:        74.74   %    70 - 86
FL/HC:      20.3   %    18.8 -
HC:      255.1  mm     G. Age:  27w 5d         15  %    HC/AC:      1.09        1.05 -
AC:      233.5  mm     G. Age:  27w 5d         33  %    FL/BPD:     74.4   %    71 - 87
FL:       51.7  mm     G. Age:  27w 4d         24  %    FL/AC:      22.1   %    20 - 24

Est. FW:    6666  gm      2 lb 7 oz     44  %
Gestational Age

LMP:           28w 6d        Date:  10/16/17                 EDD:   07/23/18
U/S Today:     27w 5d                                        EDD:   07/31/18
Best:          28w 0d     Det. By:  Previous Ultrasound      EDD:   07/29/18
(02/18/18)
Anatomy

Cranium:               Appears normal         Aortic Arch:            Previously seen
Cavum:                 Previously seen        Ductal Arch:            Previously seen
Ventricles:            Appears normal         Diaphragm:              Appears normal
Choroid Plexus:        Previously seen        Stomach:                Appears normal, left
sided
Cerebellum:            Previously seen        Abdomen:                Appears normal
Posterior Fossa:       Previously seen        Abdominal Wall:         Previously seen
Nuchal Fold:           Previously seen        Cord Vessels:           Previously seen
Face:                  Orbits and profile     Kidneys:                Appear normal
previously seen
Lips:                  Previously seen        Bladder:                Appears normal
Thoracic:              Appears normal         Spine:                  Previously seen
Heart:                 Appears normal         Upper Extremities:      Previously seen
(4CH, axis, and situs
RVOT:                  Appears normal         Lower Extremities:      Previously seen
LVOT:                  Previously seen

Other:  Female gender previously seen. Nasal bone and Heels previously
visualized.
Cervix Uterus Adnexa

Cervix
Length:           3.26  cm.
Normal appearance by transabdominal scan.
Impression

Single living intrauterine pregnancy at 28w 0d.
Cephalic presentation.
Placenta Right lateral, above cervical os.
Appropriate fetal growth.
Normal amniotic fluid volume.
The fetal anatomic survey is complete.
Normal fetal anatomy.
No fetal anomalies or soft markers of aneuploidy seen.
The cervix measures 3.26cm on transabdominal imaging
without funneling.
Recommendations

Follow-up ultrasounds as clinically indicated.

## 2018-10-21 ENCOUNTER — Encounter (HOSPITAL_COMMUNITY): Payer: Self-pay

## 2021-08-01 LAB — CYTOLOGY - PAP

## 2021-09-24 DIAGNOSIS — F329 Major depressive disorder, single episode, unspecified: Secondary | ICD-10-CM | POA: Insufficient documentation

## 2021-09-24 DIAGNOSIS — F1020 Alcohol dependence, uncomplicated: Secondary | ICD-10-CM | POA: Insufficient documentation

## 2023-03-31 ENCOUNTER — Encounter (HOSPITAL_COMMUNITY): Payer: Self-pay

## 2023-03-31 ENCOUNTER — Emergency Department (HOSPITAL_COMMUNITY)
Admission: EM | Admit: 2023-03-31 | Discharge: 2023-04-01 | Disposition: A | Discharge status: Home / Self Care | Payer: Medicaid Other | Attending: Emergency Medicine | Admitting: Emergency Medicine

## 2023-03-31 ENCOUNTER — Other Ambulatory Visit: Payer: Self-pay

## 2023-03-31 ENCOUNTER — Emergency Department (HOSPITAL_COMMUNITY): Payer: Medicaid Other

## 2023-03-31 DIAGNOSIS — S62614A Displaced fracture of proximal phalanx of right ring finger, initial encounter for closed fracture: Secondary | ICD-10-CM | POA: Insufficient documentation

## 2023-03-31 DIAGNOSIS — W1830XA Fall on same level, unspecified, initial encounter: Secondary | ICD-10-CM | POA: Diagnosis not present

## 2023-03-31 DIAGNOSIS — R55 Syncope and collapse: Secondary | ICD-10-CM | POA: Diagnosis not present

## 2023-03-31 DIAGNOSIS — S65504A Unspecified injury of blood vessel of right ring finger, initial encounter: Secondary | ICD-10-CM | POA: Diagnosis present

## 2023-03-31 LAB — CBC
HCT: 36.2 % (ref 36.0–46.0)
Hemoglobin: 12.1 g/dL (ref 12.0–15.0)
MCH: 31.9 pg (ref 26.0–34.0)
MCHC: 33.4 g/dL (ref 30.0–36.0)
MCV: 95.5 fL (ref 80.0–100.0)
Platelets: 228 10*3/uL (ref 150–400)
RBC: 3.79 MIL/uL — ABNORMAL LOW (ref 3.87–5.11)
RDW: 13.3 % (ref 11.5–15.5)
WBC: 9.4 10*3/uL (ref 4.0–10.5)
nRBC: 0 % (ref 0.0–0.2)

## 2023-03-31 LAB — BASIC METABOLIC PANEL
Anion gap: 10 (ref 5–15)
BUN: 7 mg/dL (ref 6–20)
CO2: 19 mmol/L — ABNORMAL LOW (ref 22–32)
Calcium: 9 mg/dL (ref 8.9–10.3)
Chloride: 105 mmol/L (ref 98–111)
Creatinine, Ser: 0.71 mg/dL (ref 0.44–1.00)
GFR, Estimated: >60 mL/min (ref >60)
Glucose, Bld: 197 mg/dL — ABNORMAL HIGH (ref 70–99)
Potassium: 3.2 mmol/L — ABNORMAL LOW (ref 3.5–5.1)
Sodium: 134 mmol/L — ABNORMAL LOW (ref 135–145)

## 2023-03-31 LAB — URINALYSIS, ROUTINE W REFLEX MICROSCOPIC
Glucose, UA: 50 mg/dL — AB
Hgb urine dipstick: NEGATIVE
Ketones, ur: 20 mg/dL — AB
Nitrite: NEGATIVE
Protein, ur: >=300 mg/dL — AB
Specific Gravity, Urine: 1.029 (ref 1.005–1.030)
pH: 5 (ref 5.0–8.0)

## 2023-03-31 LAB — POC URINE PREG, ED: Preg Test, Ur: NEGATIVE

## 2023-03-31 LAB — CBG MONITORING, ED: Glucose-Capillary: 125 mg/dL — ABNORMAL HIGH (ref 70–99)

## 2023-03-31 LAB — HCG, QUANTITATIVE, PREGNANCY: hCG, Beta Chain, Quant, S: 25989 m[IU]/mL — ABNORMAL HIGH (ref <5)

## 2023-03-31 MED ORDER — LIDOCAINE HCL (PF) 2 % IJ SOLN
INTRAMUSCULAR | Status: AC
  Administered 2023-04-01: 10 mL via INTRADERMAL
  Filled 2023-03-31: qty 5

## 2023-03-31 MED ORDER — LIDOCAINE HCL (PF) 2 % IJ SOLN: 10.0000 mL | Freq: Once | INTRAMUSCULAR | Status: AC

## 2023-03-31 NOTE — ED Triage Notes (Signed)
Pt arrived from home via POV c/o LOC approx 1900 pt did hit her head. Pt states that it does not hurt. Pt states the only injury from fall is right 5th digit, noted to be disfigured. 10/10 pain scale

## 2023-03-31 NOTE — ED Provider Notes (Signed)
[place] EMERGENCY DEPARTMENT AT [facility] Provider Note   CSN: [id number] Arrival date & time: 03/31/23  1928     History  Chief Complaint  Patient presents with   Loss of Consciousness    Alyssa Morgan is a 27 y.o. female.  Patient is a 26 year old female with no significant past medical history.  Patient presents here for evaluation of a syncopal episode.  She reports that she was in the kitchen cooking this afternoon when she began to feel hot and flushed.  She walked outside to get some air, then had a syncopal spell lasting several seconds.  When she passed out she injured her right wrist and right fifth finger.  She denies any palpitations.  No prior history of seizure or reported seizure activity during this episode.  The history is provided by the patient.       Home Medications Prior to Admission medications   Medication Sig Start Date End Date Taking? Authorizing Provider  Prenatal Vit w/Fe-Methylfol-FA (PNV PO) Take by mouth.    [provider]      Allergies    Mushroom extract complex    Review of Systems   Review of Systems  All other systems reviewed and are negative.   Physical Exam Updated Vital Signs BP 113/69   Pulse 84   Temp 98.6 F (37 C) (Oral)   Resp (!) 26   Ht 5\' 3"  (1.6 m)   Wt 122.5 kg   LMP 10/02/2022 (Approximate) Comment: Pt has irregular periods.  SpO2 97%   BMI 47.83 kg/m  Physical Exam Vitals and nursing note reviewed.  Constitutional:      General: She is not in acute distress.    Appearance: She is well-developed. She is not diaphoretic.  HENT:     Head: Normocephalic and atraumatic.  Eyes:     Extraocular Movements: Extraocular movements intact.     Pupils: Pupils are equal, round, and reactive to light.  Cardiovascular:     Rate and Rhythm: Normal rate and regular rhythm.     Heart sounds: No murmur heard.    No friction rub. No gallop.  Pulmonary:     Effort: Pulmonary effort is normal. No  respiratory distress.     Breath sounds: Normal breath sounds. No wheezing.  Abdominal:     General: Bowel sounds are normal. There is no distension.     Palpations: Abdomen is soft.     Tenderness: There is no abdominal tenderness.  Musculoskeletal:        General: Normal range of motion.     Cervical back: Normal range of motion and neck supple.     Comments: There is obvious deformity noted to the right fifth finger.  Skin:    General: Skin is warm and dry.  Neurological:     General: No focal deficit present.     Mental Status: She is alert and oriented to person, place, and time.     Cranial Nerves: No cranial nerve deficit.     Motor: No weakness.     Coordination: Coordination normal.     ED Results / Procedures / Treatments   Labs (all labs ordered are listed, but only abnormal results are displayed) Labs Reviewed  BASIC METABOLIC PANEL - Abnormal; Notable for the following components:      Result Value   Sodium 134 (*)    Potassium 3.2 (*)    CO2 19 (*)    Glucose, Bld 197 (*)  All other components within normal limits  CBC - Abnormal; Notable for the following components:   RBC 3.79 (*)    All other components within normal limits  URINALYSIS, ROUTINE W REFLEX MICROSCOPIC - Abnormal; Notable for the following components:   Color, Urine AMBER (*)    APPearance CLOUDY (*)    Glucose, UA 50 (*)    Bilirubin Urine SMALL (*)    Ketones, ur 20 (*)    Protein, ur >=300 (*)    Leukocytes,Ua TRACE (*)    Bacteria, UA FEW (*)    All other components within normal limits  HCG, QUANTITATIVE, PREGNANCY - Abnormal; Notable for the following components:   hCG, Beta Chain, Quant, S 25,989 (*)    All other components within normal limits  CBG MONITORING, ED - Abnormal; Notable for the following components:   Glucose-Capillary 125 (*)    All other components within normal limits  POC URINE PREG, ED    EKG None  Radiology DG Wrist Complete Right  Result Date:  03/31/2023 CLINICAL DATA:  Fall EXAM: RIGHT WRIST - COMPLETE 3 VIEW COMPARISON:  None Available. FINDINGS: There is no evidence of fracture or dislocation. There is no evidence of arthropathy or other focal bone abnormality. Soft tissues are unremarkable. IMPRESSION: Negative. Electronically Signed   By: Layla Maw M.D.   On: 03/31/2023 21:01   DG Hand Complete Right  Result Date: 03/31/2023 CLINICAL DATA:  Trauma EXAM: RIGHT HAND - COMPLETE 3 VIEW COMPARISON:  None Available. FINDINGS: Transverse fracture identified of the distal cortex fifth proximal phalanx with the distal fracture fragment displaced anteriorly by over a shaft width with associated soft tissue swelling. The remaining osseous structures appear otherwise intact. IMPRESSION: Displaced 5th proximal phalanx fracture. Electronically Signed   By: Layla Maw M.D.   On: 03/31/2023 20:59    Procedures Procedures  {Document cardiac monitor, telemetry assessment procedure when appropriate:1}  Medications Ordered in ED Medications  lidocaine HCl (PF) (XYLOCAINE) 2 % injection 10 mL (has no administration in time range)  lidocaine HCl (PF) (XYLOCAINE) 2 % injection (has no administration in time range)    ED Course/ Medical Decision Making/ A&P   {   Click here for ABCD2, HEART and other calculatorsREFRESH Note before signing :1}                          Medical Decision Making Amount and/or Complexity of Data Reviewed Labs: ordered. Radiology: ordered.  Risk Prescription drug management.   ***  {Document critical care time when appropriate:1} {Document review of labs and clinical decision tools ie heart score, Chads2Vasc2 etc:1}  {Document your independent review of radiology images, and any outside records:1} {Document your discussion with family members, caretakers, and with consultants:1} {Document social determinants of health affecting pt's care:1} {Document your decision making why or why not admission,  treatments were needed:1} Final Clinical Impression(s) / ED Diagnoses Final diagnoses:  None    Rx / DC Orders ED Discharge Orders     None

## 2023-04-01 MED ORDER — HYDROCODONE-ACETAMINOPHEN 5-325 MG PO TABS: 1.0000 | ORAL_TABLET | Freq: Four times a day (QID) | ORAL | 0 refills | Status: DC | PRN

## 2023-04-01 NOTE — ED Notes (Signed)
ED Provider at bedside. 

## 2023-04-01 NOTE — Discharge Instructions (Addendum)
Follow-up with Dr. Romeo Apple in the orthopedic clinic in the next 1 to 2 days.  His contact information has been provided in this discharge summary for you to call and make these arrangements.  Leave splint in place until followed up by orthopedics.  Ice for 20 minutes every 2 hours while awake for the next 2 days.  Take hydrocodone as prescribed as needed for pain.  Return to the emergency department if you experience any new and/or concerning issues.

## 2023-04-18 ENCOUNTER — Other Ambulatory Visit: Payer: Self-pay | Admitting: Obstetrics & Gynecology

## 2023-04-18 DIAGNOSIS — O3680X0 Pregnancy with inconclusive fetal viability, not applicable or unspecified: Secondary | ICD-10-CM

## 2023-04-21 ENCOUNTER — Ambulatory Visit: Payer: Medicaid Other

## 2023-04-21 ENCOUNTER — Other Ambulatory Visit: Payer: Self-pay | Admitting: Obstetrics & Gynecology

## 2023-04-21 DIAGNOSIS — O0932 Supervision of pregnancy with insufficient antenatal care, second trimester: Secondary | ICD-10-CM

## 2023-04-21 DIAGNOSIS — Z3A26 26 weeks gestation of pregnancy: Secondary | ICD-10-CM

## 2023-04-21 DIAGNOSIS — Z363 Encounter for antenatal screening for malformations: Secondary | ICD-10-CM

## 2023-04-21 DIAGNOSIS — O3680X0 Pregnancy with inconclusive fetal viability, not applicable or unspecified: Secondary | ICD-10-CM

## 2023-04-21 NOTE — Progress Notes (Signed)
[place] 26+2 wks,cephalic,anterior placenta gr 0,SVP of fluid 5 cm,CX 2.9 cm,normal ovaries,EFW 927 g,FHR 150 bpm,anatomy complete,limited view because of patients body habitus EDD 07/26/2023 by today's ultrasound

## 2023-04-22 ENCOUNTER — Ambulatory Visit (INDEPENDENT_AMBULATORY_CARE_PROVIDER_SITE_OTHER): Payer: Medicaid Other | Admitting: Orthopaedic Surgery

## 2023-04-22 ENCOUNTER — Encounter: Payer: Self-pay | Admitting: Orthopaedic Surgery

## 2023-04-22 ENCOUNTER — Other Ambulatory Visit (INDEPENDENT_AMBULATORY_CARE_PROVIDER_SITE_OTHER): Payer: Medicaid Other

## 2023-04-22 ENCOUNTER — Other Ambulatory Visit: Payer: Self-pay | Admitting: Orthopaedic Surgery

## 2023-04-22 VITALS — BP 124/74 | HR 84 | Ht 63.0 in | Wt 274.0 lb

## 2023-04-22 DIAGNOSIS — M79641 Pain in right hand: Secondary | ICD-10-CM

## 2023-04-22 DIAGNOSIS — S62616A Displaced fracture of proximal phalanx of right little finger, initial encounter for closed fracture: Secondary | ICD-10-CM | POA: Diagnosis not present

## 2023-04-22 DIAGNOSIS — Z6841 Body Mass Index (BMI) 40.0 and over, adult: Secondary | ICD-10-CM

## 2023-04-22 NOTE — Patient Instructions (Signed)
[facility] will call you with appointment. If you have not heard from them in 2-3 business days call them to schedule [phone]   

## 2023-04-22 NOTE — Progress Notes (Signed)
Subjective:    Patient ID: Alyssa Morgan, female    DOB: 1996-05-19, 27 y.o.   MRN: [id number]  HPI He had syncope episode on 03-31-23.  She hurt her little finger on the right.  She was seen in the ER.  X-rays showed a fracture of the little finger distally of the proximal phalanx with displacement.  She was placed in a splint.  She also found out she was pregnant.  She wore the splint for a while.  She is not in the splint today.  She has swelling and some pain of the right little finger, no redness, no wound.   Review of Systems  Constitutional:  Positive for activity change.       She is around [redacted] weeks pregnant.  Musculoskeletal:  Positive for arthralgias and joint swelling.  For Review of Systems, all other systems reviewed and are negative.  The following is a summary of the past history medically, past history surgically, known current medicines, social history and family history.  This information is gathered electronically by the computer from prior information and documentation.  I review this each visit and have found including this information at this point in the chart is beneficial and informative.   Past Medical History:  Diagnosis Date   Medical history non-contributory     Past Surgical History:  Procedure Laterality Date   NO PAST SURGERIES      Current Outpatient Medications on File Prior to Visit  Medication Sig Dispense Refill   Prenatal Vit w/Fe-Methylfol-FA (PNV PO) Take by mouth.     No current facility-administered medications on file prior to visit.    Social History   Socioeconomic History   Marital status: Single    Spouse name: Not on file   Number of children: Not on file   Years of education: Not on file   Highest education level: Not on file  Occupational History   Not on file  Tobacco Use   Smoking status: Every Day    Packs/day: 0.50    Years: 2.00    Additional pack years: 0.00    Total pack years: 1.00    Types: Cigarettes    Smokeless tobacco: Never  Substance and Sexual Activity   Alcohol use: No   Drug use: No   Sexual activity: Yes    Birth control/protection: None  Other Topics Concern   Not on file  Social History Narrative   Not on file   Social Determinants of Health   Financial Resource Strain: Not on file  Food Insecurity: Not on file  Transportation Needs: Not on file  Physical Activity: Not on file  Stress: Not on file  Social Connections: Not on file  Intimate Partner Violence: Not on file    History reviewed. No pertinent family history.  BP 124/74   Pulse 84   Ht 5\' 3"  (1.6 m)   Wt 274 lb (124.3 kg)   LMP  (LMP Unknown) Comment: Pt has irregular periods.  BMI 48.54 kg/m   Body mass index is 48.54 kg/m.      Objective:   Physical Exam Vitals and nursing note reviewed. Exam conducted with a chaperone present.  Constitutional:      Appearance: She is well-developed.     Comments: She is pregnant.  HENT:     Head: Normocephalic and atraumatic.  Eyes:     Conjunctiva/sclera: Conjunctivae normal.     Pupils: Pupils are equal, round, and reactive to light.  Cardiovascular:  Rate and Rhythm: Normal rate and regular rhythm.  Pulmonary:     Effort: Pulmonary effort is normal.  Abdominal:     Palpations: Abdomen is soft.  Musculoskeletal:       Hands:     Cervical back: Normal range of motion and neck supple.  Skin:    General: Skin is warm and dry.  Neurological:     Mental Status: She is alert and oriented to person, place, and time.     Cranial Nerves: No cranial nerve deficit.     Motor: No abnormal muscle tone.     Coordination: Coordination normal.     Deep Tendon Reflexes: Reflexes are normal and symmetric. Reflexes normal.  Psychiatric:        Behavior: Behavior normal.        Thought Content: Thought content normal.        Judgment: Judgment normal.      X-rays were done of the right little finger, reported separately.  I have reviewed the ER  records.  I have independently reviewed and interpreted x-rays of this patient done at another site by another physician or qualified health professional.      Assessment & Plan:   Encounter Diagnoses  Name Primary?   Pain in right hand Yes   Closed displaced fracture of proximal phalanx of right little finger, initial encounter    Body mass index 45.0-49.9, adult (HCC)    Morbid obesity (HCC)    She needs to be seen by a hand surgeon.  I have stressed the importance of seeing the hand surgeon.  She appears to understand.  I will buddy tape the little finger to the ring finger with gauze between.  Call if any problem.  Precautions discussed.  Electronically Signed Darreld Mclean, MD 5/21/20243:52 PM

## 2023-04-24 ENCOUNTER — Encounter: Payer: Medicaid Other | Admitting: Advanced Practice Midwife

## 2023-04-24 ENCOUNTER — Encounter: Payer: Medicaid Other | Admitting: *Deleted

## 2023-05-13 ENCOUNTER — Encounter: Payer: Medicaid Other | Admitting: *Deleted

## 2023-05-13 ENCOUNTER — Encounter: Payer: Self-pay | Admitting: Women's Health

## 2023-05-13 ENCOUNTER — Ambulatory Visit (INDEPENDENT_AMBULATORY_CARE_PROVIDER_SITE_OTHER): Payer: Medicaid Other | Admitting: Women's Health

## 2023-05-13 VITALS — BP 109/68 | HR 96 | Wt 267.0 lb

## 2023-05-13 DIAGNOSIS — Z8759 Personal history of other complications of pregnancy, childbirth and the puerperium: Secondary | ICD-10-CM

## 2023-05-13 DIAGNOSIS — Z3490 Encounter for supervision of normal pregnancy, unspecified, unspecified trimester: Secondary | ICD-10-CM | POA: Insufficient documentation

## 2023-05-13 DIAGNOSIS — O0933 Supervision of pregnancy with insufficient antenatal care, third trimester: Secondary | ICD-10-CM | POA: Insufficient documentation

## 2023-05-13 DIAGNOSIS — Z98891 History of uterine scar from previous surgery: Secondary | ICD-10-CM | POA: Diagnosis not present

## 2023-05-13 DIAGNOSIS — O0990 Supervision of high risk pregnancy, unspecified, unspecified trimester: Secondary | ICD-10-CM | POA: Insufficient documentation

## 2023-05-13 DIAGNOSIS — O09293 Supervision of pregnancy with other poor reproductive or obstetric history, third trimester: Secondary | ICD-10-CM

## 2023-05-13 DIAGNOSIS — O09299 Supervision of pregnancy with other poor reproductive or obstetric history, unspecified trimester: Secondary | ICD-10-CM | POA: Insufficient documentation

## 2023-05-13 DIAGNOSIS — Z3A29 29 weeks gestation of pregnancy: Secondary | ICD-10-CM | POA: Diagnosis not present

## 2023-05-13 DIAGNOSIS — Z3480 Encounter for supervision of other normal pregnancy, unspecified trimester: Secondary | ICD-10-CM

## 2023-05-13 DIAGNOSIS — O0993 Supervision of high risk pregnancy, unspecified, third trimester: Secondary | ICD-10-CM

## 2023-05-13 DIAGNOSIS — Z8632 Personal history of gestational diabetes: Secondary | ICD-10-CM

## 2023-05-13 DIAGNOSIS — F418 Other specified anxiety disorders: Secondary | ICD-10-CM

## 2023-05-13 DIAGNOSIS — Z8679 Personal history of other diseases of the circulatory system: Secondary | ICD-10-CM

## 2023-05-13 MED ORDER — ASPIRIN 81 MG PO TBEC
162.0000 mg | DELAYED_RELEASE_TABLET | Freq: Every day | ORAL | 2 refills | Status: DC
Start: 2023-05-13 — End: 2023-07-16

## 2023-05-13 MED ORDER — BLOOD PRESSURE MONITOR MISC
0 refills | Status: AC
Start: 2023-05-13 — End: ?

## 2023-05-13 NOTE — Patient Instructions (Signed)
[place], thank you for choosing our office today! We appreciate the opportunity to meet your healthcare needs. You may receive a short survey by mail, e-mail, or through [employer]. If you are happy with your care we would appreciate if you could take just a few minutes to complete the survey questions. We read all of your comments and take your feedback very seriously. Thank you again for choosing our office.  Center for [employer] Team at [facility]  [facility] ([address] [place], [place] [address]) Entrance C, located off of [address] Free 24/7 valet parking   You will have your sugar test next visit.  Please do not eat or drink anything after midnight the night before you come, not even water.  You will be here for at least two hours.  Please make an appointment online for the bloodwork at [email or web address] for 8:30am (or as close to this as possible). Make sure you select the [facility] service center. The day of the appointment, check in with our office first, then you will go to [facility] to start the sugar test.    CLASSES: Go to [email or web address] to register for classes (childbirth, breastfeeding, waterbirth, infant CPR, daddy bootcamp, etc.)  Call the office [phone]) or go to [facility] if: You begin to have strong, frequent contractions Your water breaks.  Sometimes it is a big gush of fluid, sometimes it is just a trickle that keeps getting your panties wet or running down your legs You have vaginal bleeding.  It is normal to have a small amount of spotting if your cervix was checked.  You don't feel your baby moving like normal.  If you don't, get you something to eat and drink and lay down and focus on feeling your baby move.   If your baby is still not moving like normal, you should call the office or go to [facility].  Call the office [phone]) or go to [facility] hospital for these signs of pre-eclampsia: Severe headache that does not  go away with Tylenol Visual changes- seeing spots, double, blurred vision Pain under your right breast or upper abdomen that does not go away with Tums or heartburn medicine Nausea and/or vomiting Severe swelling in your hands, feet, and face   Tdap Vaccine It is recommended that you get the Tdap vaccine during the third trimester of EACH pregnancy to help protect your baby from getting pertussis (whooping cough) 27-36 weeks is the BEST time to do this so that you can pass the protection on to your baby. During pregnancy is better than after pregnancy, but if you are unable to get it during pregnancy it will be offered at the hospital.  You can get this vaccine with [place], at the health department, your family doctor, or some local pharmacies Everyone who will be around your baby should also be up-to-date on their vaccines before the baby comes. Adults (who are not pregnant) only need 1 dose of Tdap during adulthood.   [facility] Pediatricians/Family [facility]): [address] [place], [phone]           [facility]: [address], [phone]                [facility]): [address], [phone] (call to ask if accepting patients) [facility] Department: [address], [place], [phone]    [facility] Pediatricians/Family [facility] (  [facility]): [address], [phone] [facility]: [address] [place], [phone] [facility] [place]: [address], [phone]  [facility] Doctors  [facility]): [phone] [facility]: [address] [phone]   [facility] Doctors [facility]: [address] [phone]  [facility] Doctors  [facility]: [phone], [phone]  Home Blood Pressure Monitoring for Patients   Your  provider has recommended that you check your blood pressure (BP) at least once a week at home. If you do not have a blood pressure cuff at home, one will be provided for you. Contact your provider if you have not received your monitor within 1 week.   Helpful Tips for Accurate Home Blood Pressure Checks  Don't smoke, exercise, or drink caffeine 30 minutes before checking your BP Use the restroom before checking your BP (a full bladder can raise your pressure) Relax in a comfortable upright chair Feet on the ground Left arm resting comfortably on a flat surface at the level of your heart Legs uncrossed Back supported Sit quietly and don't talk Place the cuff on your bare arm Adjust snuggly, so that only two fingertips can fit between your skin and the top of the cuff Check 2 readings separated by at least one minute Keep a log of your BP readings For a visual, please reference this diagram: http://ccnc.care/bpdiagram  Provider Name: [facility]     Phone: [phone]  Zone 1: ALL CLEAR  Continue to monitor your symptoms:  BP reading is less than 140 (top number) or less than 90 (bottom number)  No right upper stomach pain No headaches or seeing spots No feeling nauseated or throwing up No swelling in face and hands  Zone 2: CAUTION Call your doctor's office for any of the following:  BP reading is greater than 140 (top number) or greater than 90 (bottom number)  Stomach pain under your ribs in the middle or right side Headaches or seeing spots Feeling nauseated or throwing up Swelling in face and hands  Zone 3: EMERGENCY  Seek immediate medical care if you have any of the following:  BP reading is greater than160 (top number) or greater than 110 (bottom number) Severe headaches not improving with Tylenol Serious difficulty catching your breath Any worsening symptoms from Zone 2   Third Trimester of Pregnancy The third trimester is from week 29 through week 42, months  7 through 9. The third trimester is a time when the fetus is growing rapidly. At the end of the ninth month, the fetus is about 20 inches in length and weighs 6-10 pounds.  BODY CHANGES Your body goes through many changes during pregnancy. The changes vary from woman to woman.  Your weight will continue to increase. You can expect to gain 25-35 pounds (11-16 kg) by the end of the pregnancy. You may begin to get stretch marks on your hips, abdomen, and breasts. You may urinate more often because the fetus is moving lower into your pelvis and pressing on your bladder. You may develop or continue to have heartburn as a result of your pregnancy. You may develop constipation because certain hormones are causing the muscles that push waste through your intestines to slow down. You may develop hemorrhoids or swollen, bulging veins (varicose veins). You may have pelvic pain because of the weight gain and pregnancy hormones relaxing your joints between the bones in  your pelvis. Backaches may result from overexertion of the muscles supporting your posture. You may have changes in your hair. These can include thickening of your hair, rapid growth, and changes in texture. Some women also have hair loss during or after pregnancy, or hair that feels dry or thin. Your hair will most likely return to normal after your baby is born. Your breasts will continue to grow and be tender. A yellow discharge may leak from your breasts called colostrum. Your belly button may stick out. You may feel short of breath because of your expanding uterus. You may notice the fetus "dropping," or moving lower in your abdomen. You may have a bloody mucus discharge. This usually occurs a few days to a week before labor begins. Your cervix becomes thin and soft (effaced) near your due date. WHAT TO EXPECT AT YOUR PRENATAL EXAMS  You will have prenatal exams every 2 weeks until week 36. Then, you will have weekly prenatal exams. During a  routine prenatal visit: You will be weighed to make sure you and the fetus are growing normally. Your blood pressure is taken. Your abdomen will be measured to track your baby's growth. The fetal heartbeat will be listened to. Any test results from the previous visit will be discussed. You may have a cervical check near your due date to see if you have effaced. At around 36 weeks, your caregiver will check your cervix. At the same time, your caregiver will also perform a test on the secretions of the vaginal tissue. This test is to determine if a type of bacteria, Group B streptococcus, is present. Your caregiver will explain this further. Your caregiver may ask you: What your birth plan is. How you are feeling. If you are feeling the baby move. If you have had any abnormal symptoms, such as leaking fluid, bleeding, severe headaches, or abdominal cramping. If you have any questions. Other tests or screenings that may be performed during your third trimester include: Blood tests that check for low iron levels (anemia). Fetal testing to check the health, activity level, and growth of the fetus. Testing is done if you have certain medical conditions or if there are problems during the pregnancy. FALSE LABOR You may feel small, irregular contractions that eventually go away. These are called Braxton Hicks contractions, or false labor. Contractions may last for hours, days, or even weeks before true labor sets in. If contractions come at regular intervals, intensify, or become painful, it is best to be seen by your caregiver.  SIGNS OF LABOR  Menstrual-like cramps. Contractions that are 5 minutes apart or less. Contractions that start on the top of the uterus and spread down to the lower abdomen and back. A sense of increased pelvic pressure or back pain. A watery or bloody mucus discharge that comes from the vagina. If you have any of these signs before the 37th week of pregnancy, call your  caregiver right away. You need to go to the hospital to get checked immediately. HOME CARE INSTRUCTIONS  Avoid all smoking, herbs, alcohol, and unprescribed drugs. These chemicals affect the formation and growth of the baby. Follow your caregiver's instructions regarding medicine use. There are medicines that are either safe or unsafe to take during pregnancy. Exercise only as directed by your caregiver. Experiencing uterine cramps is a good sign to stop exercising. Continue to eat regular, healthy meals. Wear a good support bra for breast tenderness. Do not use hot tubs, steam rooms, or saunas. Wear your  seat belt at all times when driving. Avoid raw meat, uncooked cheese, cat litter boxes, and soil used by cats. These carry germs that can cause birth defects in the baby. Take your prenatal vitamins. Try taking a stool softener (if your caregiver approves) if you develop constipation. Eat more high-fiber foods, such as fresh vegetables or fruit and whole grains. Drink plenty of fluids to keep your urine clear or pale yellow. Take warm sitz baths to soothe any pain or discomfort caused by hemorrhoids. Use hemorrhoid cream if your caregiver approves. If you develop varicose veins, wear support hose. Elevate your feet for 15 minutes, 3-4 times a day. Limit salt in your diet. Avoid heavy lifting, wear low heal shoes, and practice good posture. Rest a lot with your legs elevated if you have leg cramps or low back pain. Visit your dentist if you have not gone during your pregnancy. Use a soft toothbrush to brush your teeth and be gentle when you floss. A sexual relationship may be continued unless your caregiver directs you otherwise. Do not travel far distances unless it is absolutely necessary and only with the approval of your caregiver. Take prenatal classes to understand, practice, and ask questions about the labor and delivery. Make a trial run to the hospital. Pack your hospital bag. Prepare  the baby's nursery. Continue to go to all your prenatal visits as directed by your caregiver. SEEK MEDICAL CARE IF: You are unsure if you are in labor or if your water has broken. You have dizziness. You have mild pelvic cramps, pelvic pressure, or nagging pain in your abdominal area. You have persistent nausea, vomiting, or diarrhea. You have a bad smelling vaginal discharge. You have pain with urination. SEEK IMMEDIATE MEDICAL CARE IF:  You have a fever. You are leaking fluid from your vagina. You have spotting or bleeding from your vagina. You have severe abdominal cramping or pain. You have rapid weight loss or gain. You have shortness of breath with chest pain. You notice sudden or extreme swelling of your face, hands, ankles, feet, or legs. You have not felt your baby move in over an hour. You have severe headaches that do not go away with medicine. You have vision changes. Document Released: 11/12/2001 Document Revised: 11/23/2013 Document Reviewed: 01/19/2013 [facility] Patient Information 2015 [place], [place]. This information is not intended to replace advice given to you by your health care provider. Make sure you discuss any questions you have with your health care provider.

## 2023-05-13 NOTE — Progress Notes (Signed)
INITIAL OBSTETRICAL VISIT Patient name: Alyssa Morgan MRN [id number]  Date of birth: February 28, 1996 Chief Complaint:   Initial Prenatal Visit  History of Present Illness:   Alyssa Morgan is a 27 y.o. G4P1011 African-American female at [redacted]w[redacted]d by [place] at 81 weeks with an Estimated Date of Delivery: 07/26/23 being seen today for her initial obstetrical visit.   No LMP recorded (lmp unknown). Patient is pregnant. Her obstetrical history is significant for  TAB d/t semi lobular holoprosencephaly, then term PCS after failed IOL/FTP-unsure of how dilated she was, but did not push, baby 7lb she thinks- had some pp HTN and incisional seroma. Pregnancy complicated by A1DM .   Today she reports no complaints. Late care d/t transportation issues Last pap 08/01/21. Results were:  NILM in [place]  Review of Systems:   Pertinent items are noted in HPI Denies cramping/contractions, leakage of fluid, vaginal bleeding, abnormal vaginal discharge w/ itching/odor/irritation, headaches, visual changes, shortness of breath, chest pain, abdominal pain, severe nausea/vomiting, or problems with urination or bowel movements unless otherwise stated above.  Pertinent History Reviewed:  Reviewed past medical,surgical, social, obstetrical and family history.  Reviewed problem list, medications and allergies. OB History  Gravida Para Term Preterm AB Living  3 1 1   1 1   SAB IAB Ectopic Multiple Live Births    1     1    # Outcome Date GA Lbr Len/2nd Weight Sex Delivery Anes PTL Lv  3 Current           2 Term 07/22/18 [redacted]w[redacted]d  7 lb (3.175 kg) F CS-LTranv EPI N LIV     Complications: Failure to Progress in First Stage, Gestational diabetes  1 IAB              Birth Comments: semi lobar holoprosencephaly   Physical Assessment:   Vitals:   05/13/23 1530  BP: 109/68  Pulse: 96  Weight: 267 lb (121.1 kg)  Body mass index is 47.3 kg/m.       Physical Examination:  General appearance - well appearing, and in no  distress  Mental status - alert, oriented to person, place, and time  Psych:  She has a normal mood and affect  Skin - warm and dry, normal color, no suspicious lesions noted  Chest - effort normal, all lung fields clear to auscultation bilaterally  Heart - normal rate and regular rhythm  Abdomen - soft, nontender  Extremities:  No swelling or varicosities noted  Thin prep pap is not done   Chaperone: N/A    TODAY'S FH: 31, FHR 152 via doppler  No results found for this or any previous visit (from the past 24 hour(s)).  Assessment & Plan:  1) High-Risk Pregnancy G3P0010 at 103w3d with an Estimated Date of Delivery: 07/26/23   2) Initial OB visit  3) PG BMI 47> U/S q4wks   2x/wk nst or weekly BPP @ 34wks   Deliver @ 39-40wks   4) Prev c/s> failed IOL/FTB, discussed TOLAC, gave consent to review  5) Possibly wants BTL> reviewed risks/benefits, wants to think a little more before signing consent  6) H/O A1DM> GTT ASAP  7) H/O PPHTN> ASA 162mg , baseline labs  8) Late prenatal care> d/t transportation  9) Dep/anx> no meds, I[facility] referral ordered  Meds:  Meds ordered this encounter  Medications   Blood Pressure Monitor MISC    Sig: For regular home bp monitoring during pregnancy    Dispense:  1 each  Refill:  0    Z34.81 Please mail to patient Needs large cuff   aspirin EC 81 MG tablet    Sig: Take 2 tablets (162 mg total) by mouth daily. Swallow whole.    Dispense:  180 tablet    Refill:  2    Initial labs obtained, declined Tdap Continue prenatal vitamins Reviewed ptl s/s, reasons to seek care Reviewed recommended weight gain based on pre-gravid BMI Encouraged well-balanced diet Genetic & carrier screening discussed: declines Panorama and Horizon , too late for NT/IT and AFP Ultrasound discussed; fetal survey: results reviewed [facility]NC completed> form faxed if has or is planning to apply for medicaid The nature of [place] [facility] [employer] with  multiple MDs and other Advanced Practice Providers was explained to patient; also emphasized that fellows, residents, and students are part of our team. Does not have home bp cuff. Office bp cuff given: no. Rx sent: yes. Check bp weekly, let us know if consistently >140/90.   Follow-up: Return for asap pn2 (no visit), then 2wks EFW u/s and HROB w/ MD/CNM .   Orders Placed This Encounter  Procedures   Urine Culture   GC/Chlamydia Probe Amp   US OB Follow Up   CBC/D/Plt+RPR+Rh+ABO+RubIgG...   Glucose Tolerance, 2 Hours w/1 Hour   Comprehensive metabolic panel   Protein / creatinine ratio, urine   Amb ref to [facility]    Cheral Marker CNM, [facility] 05/13/2023 5:05 PM

## 2023-05-16 LAB — GC/CHLAMYDIA PROBE AMP
Chlamydia trachomatis, NAA: NEGATIVE
Neisseria Gonorrhoeae by PCR: NEGATIVE

## 2023-05-17 LAB — URINE CULTURE

## 2023-05-19 ENCOUNTER — Encounter: Payer: Self-pay | Admitting: Women's Health

## 2023-05-19 DIAGNOSIS — O99891 Other specified diseases and conditions complicating pregnancy: Secondary | ICD-10-CM | POA: Insufficient documentation

## 2023-05-19 MED ORDER — NITROFURANTOIN MONOHYD MACRO 100 MG PO CAPS: 100.0000 mg | ORAL_CAPSULE | Freq: Two times a day (BID) | ORAL | 0 refills | Status: DC

## 2023-05-19 NOTE — Addendum Note (Signed)
Addended by: Shawna Clamp R on: 05/19/2023 01:12 PM   Modules accepted: Orders

## 2023-05-20 ENCOUNTER — Other Ambulatory Visit: Payer: Self-pay | Admitting: Women's Health

## 2023-05-20 ENCOUNTER — Other Ambulatory Visit: Payer: Medicaid Other

## 2023-05-21 ENCOUNTER — Encounter: Payer: Self-pay | Admitting: *Deleted

## 2023-05-21 ENCOUNTER — Other Ambulatory Visit: Payer: Self-pay | Admitting: *Deleted

## 2023-05-21 ENCOUNTER — Telehealth: Payer: Self-pay | Admitting: Clinical

## 2023-05-21 ENCOUNTER — Telehealth: Payer: Self-pay | Admitting: Family Medicine

## 2023-05-21 ENCOUNTER — Encounter: Payer: Self-pay | Admitting: Women's Health

## 2023-05-21 DIAGNOSIS — Z8679 Personal history of other diseases of the circulatory system: Secondary | ICD-10-CM | POA: Insufficient documentation

## 2023-05-21 DIAGNOSIS — O24419 Gestational diabetes mellitus in pregnancy, unspecified control: Secondary | ICD-10-CM | POA: Insufficient documentation

## 2023-05-21 DIAGNOSIS — Z8632 Personal history of gestational diabetes: Secondary | ICD-10-CM | POA: Insufficient documentation

## 2023-05-21 DIAGNOSIS — Z8759 Personal history of other complications of pregnancy, childbirth and the puerperium: Secondary | ICD-10-CM | POA: Insufficient documentation

## 2023-05-21 DIAGNOSIS — O24410 Gestational diabetes mellitus in pregnancy, diet controlled: Secondary | ICD-10-CM

## 2023-05-21 LAB — CBC/D/PLT+RPR+RH+ABO+RUBIGG...
Antibody Screen: NEGATIVE
Basophils Absolute: 0 10*3/uL (ref 0.0–0.2)
Basos: 0 %
EOS (ABSOLUTE): 0 10*3/uL (ref 0.0–0.4)
Eos: 0 %
HCV Ab: NONREACTIVE
HIV Screen 4th Generation wRfx: NONREACTIVE
Hematocrit: 33.9 % — ABNORMAL LOW (ref 34.0–46.6)
Hemoglobin: 11.4 g/dL (ref 11.1–15.9)
Hepatitis B Surface Ag: NEGATIVE
Immature Grans (Abs): 0 10*3/uL (ref 0.0–0.1)
Immature Granulocytes: 0 %
Lymphocytes Absolute: 1.5 10*3/uL (ref 0.7–3.1)
Lymphs: 24 %
MCH: 31.8 pg (ref 26.6–33.0)
MCHC: 33.6 g/dL (ref 31.5–35.7)
MCV: 95 fL (ref 79–97)
Monocytes Absolute: 0.4 10*3/uL (ref 0.1–0.9)
Monocytes: 6 %
Neutrophils Absolute: 4.3 10*3/uL (ref 1.4–7.0)
Neutrophils: 70 %
Platelets: 177 10*3/uL (ref 150–450)
RBC: 3.58 x10E6/uL — ABNORMAL LOW (ref 3.77–5.28)
RDW: 12.4 % (ref 11.7–15.4)
RPR Ser Ql: NONREACTIVE
Rh Factor: POSITIVE
Rubella Antibodies, IGG: <0.9 index — ABNORMAL LOW (ref >0.99)
WBC: 6.1 10*3/uL (ref 3.4–10.8)

## 2023-05-21 LAB — COMPREHENSIVE METABOLIC PANEL
ALT: 12 IU/L (ref 0–32)
AST: 15 IU/L (ref 0–40)
Albumin: 3.5 g/dL — ABNORMAL LOW (ref 4.0–5.0)
Alkaline Phosphatase: 80 IU/L (ref 44–121)
BUN/Creatinine Ratio: 8 — ABNORMAL LOW (ref 9–23)
BUN: 4 mg/dL — ABNORMAL LOW (ref 6–20)
Bilirubin Total: 0.3 mg/dL (ref 0.0–1.2)
CO2: 18 mmol/L — ABNORMAL LOW (ref 20–29)
Calcium: 9.3 mg/dL (ref 8.7–10.2)
Chloride: 104 mmol/L (ref 96–106)
Creatinine, Ser: 0.5 mg/dL — ABNORMAL LOW (ref 0.57–1.00)
Globulin, Total: 2.6 g/dL (ref 1.5–4.5)
Glucose: 113 mg/dL — ABNORMAL HIGH (ref 70–99)
Potassium: 4 mmol/L (ref 3.5–5.2)
Sodium: 136 mmol/L (ref 134–144)
Total Protein: 6.1 g/dL (ref 6.0–8.5)
eGFR: 132 mL/min/{1.73_m2} (ref >59)

## 2023-05-21 LAB — GLUCOSE TOLERANCE, 2 HOURS W/ 1HR
Glucose, 1 hour: 192 mg/dL — ABNORMAL HIGH (ref 70–179)
Glucose, 2 hour: 183 mg/dL — ABNORMAL HIGH (ref 70–152)
Glucose, Fasting: 107 mg/dL — ABNORMAL HIGH (ref 70–91)

## 2023-05-21 LAB — PROTEIN / CREATININE RATIO, URINE
Creatinine, Urine: 126.9 mg/dL
Protein, Ur: 12.4 mg/dL
Protein/Creat Ratio: 98 mg/g creat (ref 0–200)

## 2023-05-21 LAB — HCV INTERPRETATION

## 2023-05-21 MED ORDER — ACCU-CHEK SOFTCLIX LANCETS MISC
12 refills | Status: DC
Start: 2023-05-21 — End: 2023-09-24

## 2023-05-21 MED ORDER — ACCU-CHEK GUIDE ME W/DEVICE KIT: 1.0000 | PACK | Freq: Four times a day (QID) | 0 refills | Status: DC

## 2023-05-21 MED ORDER — ACCU-CHEK GUIDE VI STRP
ORAL_STRIP | 12 refills | Status: DC
Start: 2023-05-21 — End: 2023-09-24

## 2023-05-21 NOTE — Telephone Encounter (Signed)
-----   Message from Moss Mc, RN sent at 05/21/2023 11:03 AM EDT ----- Regarding: GDM Referral This patient needs a referral for newly diagnosed gestational diabetes.  Thanks. Alyssa Morgan

## 2023-05-21 NOTE — Telephone Encounter (Signed)
Attempted to reach patient to inform her of a scheduled appointment at the [facility]. Left a detailed message for her to call the office or respond via MyChart for questions or rescheduling.

## 2023-05-21 NOTE — Telephone Encounter (Signed)
Attempt call regarding referral; Left HIPPA-compliant message to call back Johany Hansman from [facility] at  [phone] (Aribelle Mccosh's office).   

## 2023-05-26 ENCOUNTER — Encounter: Payer: Self-pay | Admitting: Women's Health

## 2023-05-26 DIAGNOSIS — O09899 Supervision of other high risk pregnancies, unspecified trimester: Secondary | ICD-10-CM | POA: Insufficient documentation

## 2023-05-27 ENCOUNTER — Ambulatory Visit (INDEPENDENT_AMBULATORY_CARE_PROVIDER_SITE_OTHER): Payer: Medicaid Other | Admitting: Obstetrics & Gynecology

## 2023-05-27 ENCOUNTER — Encounter: Payer: Self-pay | Admitting: Obstetrics & Gynecology

## 2023-05-27 VITALS — BP 113/73 | HR 95 | Wt 272.0 lb

## 2023-05-27 DIAGNOSIS — O24419 Gestational diabetes mellitus in pregnancy, unspecified control: Secondary | ICD-10-CM

## 2023-05-27 DIAGNOSIS — Z3A31 31 weeks gestation of pregnancy: Secondary | ICD-10-CM

## 2023-05-27 DIAGNOSIS — O0993 Supervision of high risk pregnancy, unspecified, third trimester: Secondary | ICD-10-CM

## 2023-05-27 DIAGNOSIS — Z98891 History of uterine scar from previous surgery: Secondary | ICD-10-CM

## 2023-05-27 DIAGNOSIS — Z87440 Personal history of urinary (tract) infections: Secondary | ICD-10-CM

## 2023-05-27 DIAGNOSIS — O0990 Supervision of high risk pregnancy, unspecified, unspecified trimester: Secondary | ICD-10-CM

## 2023-05-27 MED ORDER — METFORMIN HCL 500 MG PO TABS: ORAL_TABLET | ORAL | 3 refills | Status: DC

## 2023-05-27 NOTE — Progress Notes (Signed)
HIGH-RISK PREGNANCY VISIT Patient name: Alyssa Morgan MRN [id number]  Date of birth: 25-Feb-1996 Chief Complaint:   Routine Prenatal Visit  History of Present Illness:   Alyssa Villena is a 27 y.o. G56P1011 female at [redacted]w[redacted]d with an Estimated Date of Delivery: 07/26/23 being seen today for ongoing management of a high-risk pregnancy complicated by diabetes mellitus A2DM currently on metformin 500 am/qhs .    Today she reports no complaints. Contractions: Irregular. Vag. Bleeding: None.  Movement: Present. denies leaking of fluid.      05/13/2023    3:28 PM  Depression screen PHQ 2/9  Decreased Interest 2  Down, Depressed, Hopeless 2  PHQ - 2 Score 4  Altered sleeping 3  Tired, decreased energy 3  Change in appetite 0  Feeling bad or failure about yourself  3  Trouble concentrating 0  Moving slowly or fidgety/restless 2  Suicidal thoughts 2  PHQ-9 Score 17        05/13/2023    3:28 PM  GAD 7 : Generalized Anxiety Score  Nervous, Anxious, on Edge 3  Control/stop worrying 3  Worry too much - different things 3  Trouble relaxing 3  Restless 3  Easily annoyed or irritable 3  Afraid - awful might happen 3  Total GAD 7 Score 21     Review of Systems:   Pertinent items are noted in HPI Denies abnormal vaginal discharge w/ itching/odor/irritation, headaches, visual changes, shortness of breath, chest pain, abdominal pain, severe nausea/vomiting, or problems with urination or bowel movements unless otherwise stated above. Pertinent History Reviewed:  Reviewed past medical,surgical, social, obstetrical and family history.  Reviewed problem list, medications and allergies. Physical Assessment:   Vitals:   05/27/23 1550  BP: 113/73  Pulse: 95  Weight: 272 lb (123.4 kg)  Body mass index is 48.18 kg/m.           Physical Examination:   General appearance: alert, well appearing, and in no distress  Mental status: alert, oriented to person, place, and time  Skin: warm & dry    Extremities:      Cardiovascular: normal heart rate noted  Respiratory: normal respiratory effort, no distress  Abdomen: gravid, soft, non-tender  Pelvic: Cervical exam deferred         Fetal Status: Fetal Heart Rate (bpm): 153 Fundal Height: 32 cm Movement: Present    Fetal Surveillance Testing today:    Chaperone: N/A    No results found for this or any previous visit (from the past 24 hour(s)).  Assessment & Plan:  High-risk pregnancy: G3P1011 at [redacted]w[redacted]d with an Estimated Date of Delivery: 07/26/23      ICD-10-CM   1. Supervision of high risk pregnancy, antepartum  O09.90 Urine Culture    2. Gestational diabetes mellitus, class A2: metformin 500 am + hs  O24.419     3. History of UTI  Z87.440 Urine Culture    4. History of cesarean delivery: for repeat  Z98.891         Meds:  Meds ordered this encounter  Medications   metFORMIN (GLUCOPHAGE) 500 MG tablet    Sig: 1 tablet at breakfast and 1 at bedtime    Dispense:  60 tablet    Refill:  3    Orders:  Orders Placed This Encounter  Procedures   Urine Culture     Labs/procedures today: none  Treatment Plan:  twice weekly surveillance EFW 36 weeks    Follow-up: Return in about 1 week (around  06/03/2023) for begin twice weekly NSTs, needs sono EFW at 36 weeks.   Future Appointments  Date Time Provider Department Center  06/03/2023  3:45 PM [facility] [place] [facility] None  06/03/2023  4:30 PM Jodie Leiner, Amaryllis Dyke, MD [facility]    Orders Placed This Encounter  Procedures   Urine Culture   Lazaro Arms  Attending Physician for the Center for [facility] 05/27/2023 4:40 PM

## 2023-05-29 LAB — URINE CULTURE

## 2023-06-03 ENCOUNTER — Other Ambulatory Visit: Payer: Self-pay | Admitting: Women's Health

## 2023-06-03 ENCOUNTER — Ambulatory Visit (INDEPENDENT_AMBULATORY_CARE_PROVIDER_SITE_OTHER): Payer: Medicaid Other | Admitting: Obstetrics & Gynecology

## 2023-06-03 ENCOUNTER — Ambulatory Visit (INDEPENDENT_AMBULATORY_CARE_PROVIDER_SITE_OTHER): Payer: Medicaid Other

## 2023-06-03 VITALS — BP 133/83 | HR 86 | Wt 278.0 lb

## 2023-06-03 DIAGNOSIS — Z3A32 32 weeks gestation of pregnancy: Secondary | ICD-10-CM | POA: Diagnosis not present

## 2023-06-03 DIAGNOSIS — Z8679 Personal history of other diseases of the circulatory system: Secondary | ICD-10-CM

## 2023-06-03 DIAGNOSIS — O09299 Supervision of pregnancy with other poor reproductive or obstetric history, unspecified trimester: Secondary | ICD-10-CM

## 2023-06-03 DIAGNOSIS — O0993 Supervision of high risk pregnancy, unspecified, third trimester: Secondary | ICD-10-CM

## 2023-06-03 DIAGNOSIS — O24419 Gestational diabetes mellitus in pregnancy, unspecified control: Secondary | ICD-10-CM

## 2023-06-03 DIAGNOSIS — Z98891 History of uterine scar from previous surgery: Secondary | ICD-10-CM

## 2023-06-03 DIAGNOSIS — O0933 Supervision of pregnancy with insufficient antenatal care, third trimester: Secondary | ICD-10-CM

## 2023-06-03 DIAGNOSIS — F418 Other specified anxiety disorders: Secondary | ICD-10-CM

## 2023-06-03 DIAGNOSIS — O0990 Supervision of high risk pregnancy, unspecified, unspecified trimester: Secondary | ICD-10-CM

## 2023-06-03 DIAGNOSIS — Z3A29 29 weeks gestation of pregnancy: Secondary | ICD-10-CM

## 2023-06-03 DIAGNOSIS — Z3480 Encounter for supervision of other normal pregnancy, unspecified trimester: Secondary | ICD-10-CM

## 2023-06-03 NOTE — Progress Notes (Signed)
HIGH-RISK PREGNANCY VISIT Patient name: Alyssa Morgan MRN [id number]  Date of birth: August 25, 1996 Chief Complaint:   Routine Prenatal Visit ([place] today)  History of Present Illness:   Alyssa Weideman is a 27 y.o. G66P1011 female at [redacted]w[redacted]d with an Estimated Date of Delivery: 07/26/23 being seen today for ongoing management of a high-risk pregnancy complicated by diabetes mellitus A2DM currently on metformin 500 mg BID .    Today she reports no complaints. Contractions: Not present. Vag. Bleeding: None.  Movement: Present. denies leaking of fluid.      05/13/2023    3:28 PM  Depression screen PHQ 2/9  Decreased Interest 2  Down, Depressed, Hopeless 2  PHQ - 2 Score 4  Altered sleeping 3  Tired, decreased energy 3  Change in appetite 0  Feeling bad or failure about yourself  3  Trouble concentrating 0  Moving slowly or fidgety/restless 2  Suicidal thoughts 2  PHQ-9 Score 17        05/13/2023    3:28 PM  GAD 7 : Generalized Anxiety Score  Nervous, Anxious, on Edge 3  Control/stop worrying 3  Worry too much - different things 3  Trouble relaxing 3  Restless 3  Easily annoyed or irritable 3  Afraid - awful might happen 3  Total GAD 7 Score 21     Review of Systems:   Pertinent items are noted in HPI Denies abnormal vaginal discharge w/ itching/odor/irritation, headaches, visual changes, shortness of breath, chest pain, abdominal pain, severe nausea/vomiting, or problems with urination or bowel movements unless otherwise stated above. Pertinent History Reviewed:  Reviewed past medical,surgical, social, obstetrical and family history.  Reviewed problem list, medications and allergies. Physical Assessment:   Vitals:   06/03/23 1629  BP: 133/83  Pulse: 86  Weight: 278 lb (126.1 kg)  Body mass index is 49.25 kg/m.           Physical Examination:   General appearance: alert, well appearing, and in no distress  Mental status: alert, oriented to person, place, and time  Skin:  warm & dry   Extremities:      Cardiovascular: normal heart rate noted  Respiratory: normal respiratory effort, no distress  Abdomen: gravid, soft, non-tender  Pelvic: Cervical exam deferred         Fetal Status:     Movement: Present    Fetal Surveillance Testing today: BPP 8/8 EFW 69%   Chaperone: N/A    No results found for this or any previous visit (from the past 24 hour(s)).  Assessment & Plan:  High-risk pregnancy: G3P1011 at [redacted]w[redacted]d with an Estimated Date of Delivery: 07/26/23      ICD-10-CM   1. Supervision of high risk pregnancy, antepartum  O09.90     2. Gestational diabetes mellitus, class A2: metformin 500 am + hs  O24.419        Meds: No orders of the defined types were placed in this encounter.   Orders: No orders of the defined types were placed in this encounter.    Labs/procedures today: U/S  Treatment Plan:  appts scheduled   Follow-up: No follow-ups on file.   Future Appointments  Date Time Provider Department Center  06/09/2023  3:30 PM [facility] NURSE [facility]  06/09/2023  3:30 PM Myna Hidalgo, DO [facility]  06/12/2023  3:30 PM [facility] NURSE [facility]  06/16/2023  3:30 PM [facility] NURSE [facility]  06/16/2023  3:30 PM Myna Hidalgo, DO [facility]  06/19/2023  3:30  PM [facility] NURSE [facility]  06/23/2023  3:30 PM Jacklyn Shell, CNM [facility]  06/23/2023  3:30 PM [facility] NURSE [facility]  06/26/2023  3:10 PM [facility] NURSE [facility]  07/01/2023  3:45 PM [facility] [place] [facility] None  07/01/2023  4:30 PM Lazaro Arms, MD [facility]  07/04/2023 11:10 AM [facility] NURSE [facility]  07/07/2023  3:30 PM [facility] NURSE [facility]  07/07/2023  3:50 PM Myna Hidalgo, DO [facility]  07/10/2023  3:30 PM [facility] NURSE [facility]  07/14/2023  3:30 PM [facility] NURSE [facility]  07/14/2023  3:50 PM Cheral Marker, CNM [facility]  07/17/2023 11:10 AM  [facility] NURSE [facility]  07/21/2023  3:10 PM [facility]  07/21/2023  3:30 PM Cresenzo-Dishmon, Scarlette Calico, CNM [facility]  07/24/2023  3:30 PM [facility] NURSE [facility]    No orders of the defined types were placed in this encounter.  Lazaro Arms  Attending Physician for the Center for [facility] 06/03/2023 4:49 PM

## 2023-06-03 NOTE — Progress Notes (Signed)
[place] 32+3 wks,cephalic, BPP 8/8,anterior placenta gr 0,FHR 146 bpm,AFI 15 cm,EFW 2175 g 69%,limited view because of pt body habitus

## 2023-06-06 ENCOUNTER — Other Ambulatory Visit: Payer: Medicaid Other

## 2023-06-09 ENCOUNTER — Ambulatory Visit (INDEPENDENT_AMBULATORY_CARE_PROVIDER_SITE_OTHER): Payer: Medicaid Other | Admitting: Obstetrics & Gynecology

## 2023-06-09 ENCOUNTER — Ambulatory Visit: Payer: Medicaid Other

## 2023-06-09 ENCOUNTER — Encounter: Payer: Self-pay | Admitting: Obstetrics & Gynecology

## 2023-06-09 VITALS — Wt 280.0 lb

## 2023-06-09 DIAGNOSIS — Z1389 Encounter for screening for other disorder: Secondary | ICD-10-CM

## 2023-06-09 DIAGNOSIS — Z3A33 33 weeks gestation of pregnancy: Secondary | ICD-10-CM

## 2023-06-09 DIAGNOSIS — O24415 Gestational diabetes mellitus in pregnancy, controlled by oral hypoglycemic drugs: Secondary | ICD-10-CM

## 2023-06-09 DIAGNOSIS — Z331 Pregnant state, incidental: Secondary | ICD-10-CM

## 2023-06-09 DIAGNOSIS — O0993 Supervision of high risk pregnancy, unspecified, third trimester: Secondary | ICD-10-CM

## 2023-06-09 DIAGNOSIS — O0990 Supervision of high risk pregnancy, unspecified, unspecified trimester: Secondary | ICD-10-CM

## 2023-06-09 LAB — POCT URINALYSIS DIPSTICK OB
Blood, UA: NEGATIVE
Glucose, UA: NEGATIVE
Ketones, UA: NEGATIVE
Leukocytes, UA: NEGATIVE
Nitrite, UA: NEGATIVE
POC,PROTEIN,UA: NEGATIVE

## 2023-06-09 MED ORDER — METFORMIN HCL 500 MG PO TABS
ORAL_TABLET | ORAL | 6 refills | Status: DC
Start: 2023-06-09 — End: 2023-07-16

## 2023-06-09 NOTE — Progress Notes (Signed)
HIGH-RISK PREGNANCY VISIT Patient name: Alyssa Morgan MRN [id number]  Date of birth: 12/23/1995 Chief Complaint:   Routine Prenatal Visit and Non-stress Test  History of Present Illness:   Alyssa Morgan is a 27 y.o. G50P1011 female at [redacted]w[redacted]d with an Estimated Date of Delivery: 07/26/23 being seen today for ongoing management of a high-risk pregnancy complicated by:  -GDMA2/uncontrolled Sugars reviewed, patient not checking regularly Majority of breakfast elevated  -Prior C-section -Obesity  Today she reports no complaints.   Contractions: Irregular.  .  Movement: Present. denies leaking of fluid.      05/13/2023    3:28 PM  Depression screen PHQ 2/9  Decreased Interest 2  Down, Depressed, Hopeless 2  PHQ - 2 Score 4  Altered sleeping 3  Tired, decreased energy 3  Change in appetite 0  Feeling bad or failure about yourself  3  Trouble concentrating 0  Moving slowly or fidgety/restless 2  Suicidal thoughts 2  PHQ-9 Score 17     Current Outpatient Medications  Medication Instructions   Accu-Chek Softclix Lancets lancets Use as instructed to check blood sugar 4 times daily   aspirin EC 162 mg, Oral, Daily, Swallow whole.   Blood Glucose Monitoring Suppl (ACCU-CHEK GUIDE ME) w/Device KIT 1 each, Does not apply, 4 times daily   Blood Pressure Monitor MISC For regular home bp monitoring during pregnancy   glucose blood (ACCU-CHEK GUIDE) test strip Use as instructed to check blood sugar 4 times daily   metFORMIN (GLUCOPHAGE) 500 MG tablet 1 tablet at breakfast and 1 at bedtime   nitrofurantoin (macrocrystal-monohydrate) (MACROBID) 100 mg, Oral, 2 times daily, X 7 days   Prenatal Vit w/Fe-Methylfol-FA (PNV PO) Oral     Review of Systems:   Pertinent items are noted in HPI Denies abnormal vaginal discharge w/ itching/odor/irritation, headaches, visual changes, shortness of breath, chest pain, abdominal pain, severe nausea/vomiting, or problems with urination or bowel movements  unless otherwise stated above. Pertinent History Reviewed:  Reviewed past medical,surgical, social, obstetrical and family history.  Reviewed problem list, medications and allergies. Physical Assessment:   Vitals:   06/09/23 1514  Weight: 280 lb (127 kg)  Body mass index is 49.6 kg/m.           Physical Examination:   General appearance: alert, well appearing, and in no distress  Mental status: normal mood, behavior, speech, dress, motor activity, and thought processes  Skin: warm & dry   Extremities: Edema: None    Cardiovascular: normal heart rate noted  Respiratory: normal respiratory effort, no distress  Abdomen: gravid, soft, non-tender  Pelvic: Cervical exam deferred         Fetal Status:     Movement: Present    Fetal Surveillance Testing today: NST  NST being performed due to GDMA2   Fetal Monitoring:  Baseline: 140 bpm, Variability: moderate, Accelerations: present, The accelerations are >15 bpm and more than 2 in 20 minutes, and Decelerations: Absent     Final diagnosis:   Reactive NST      Chaperone: N/A    No results found for this or any previous visit (from the past 24 hour(s)).   Assessment & Plan:  High-risk pregnancy: G3P1011 at [redacted]w[redacted]d with an Estimated Date of Delivery: 07/26/23   1) GDMA2-uncontrolled -Increase metformin to 2 tabs in a.m. and 1 tab in p.m. -Long discussion with patient regarding importance of sugar, downloaded my fitness pal app and discussed nutrition referral which she declined -Discussed pending sugar management may consider  earlier delivery  2) Contraception -Patient does not desire another pregnancy -Reviewed permanent sterilization via salpingectomy -Risk benefits discussed with patient, informed consent obtained  3) Prior C-section Patient desires repeat C-section with sterilization  Meds: No orders of the defined types were placed in this encounter.   Labs/procedures today: NST  Treatment Plan: Continue antepartum  testing, timing of delivery pending sugar management  Reviewed: Preterm labor symptoms and general obstetric precautions including but not limited to vaginal bleeding, contractions, leaking of fluid and fetal movement were reviewed in detail with the patient.  All questions were answered. Pt has home bp cuff. Check bp weekly, let us know if >140/90.   Follow-up: No follow-ups on file.   Future Appointments  Date Time Provider Department Center  06/12/2023  3:30 PM [facility] NURSE [facility]  06/16/2023  3:30 PM [facility]UR[facility]  06/16/2023  3:30 PM Myna Hidalgo, DO [facility]  06/19/2023  3:30 PM [facility]UR[facility]  06/23/2023  3:30 PM Jacklyn Shell, CNM [facility]  06/23/2023  3:30 PM [facility]UR[facility]  06/26/2023  3:10 PM [facility]URSE [facility]  07/01/2023  3:45 PM [facility] [place] [facility] None  07/01/2023  4:30 PM Lazaro Arms, MD [facility]  07/04/2023 11:10 AM [facility] NURSE [facility]  07/07/2023  3:30 PM [facility]UR[facility]  07/07/2023  3:50 PM Myna Hidalgo, DO [facility]  07/10/2023  3:30 PM [facility] NURSE [facility]  07/14/2023  3:30 PM [facility] NUR[facility]  07/14/2023  3:50 PM Cheral Marker, CNM [facility]  07/17/2023 11:10 AM [facility]UR[facility]  07/21/2023  3:10 PM [facility]UR[facility]  07/21/2023  3:30 PM Jacklyn Shell, CNM [facility]  07/24/2023  3:30 PM [facility] NURSE [facility]    Orders Placed This Encounter  Procedures   POC Urinalysis Dipstick OB    Myna Hidalgo, DO Attending Obstetrician & Gynecologist, Faculty Practice Center for [employer], [facility]

## 2023-06-12 ENCOUNTER — Ambulatory Visit (INDEPENDENT_AMBULATORY_CARE_PROVIDER_SITE_OTHER): Payer: Medicaid Other | Admitting: *Deleted

## 2023-06-12 ENCOUNTER — Other Ambulatory Visit: Payer: Medicaid Other

## 2023-06-12 DIAGNOSIS — O99213 Obesity complicating pregnancy, third trimester: Secondary | ICD-10-CM | POA: Diagnosis not present

## 2023-06-12 DIAGNOSIS — E668 Other obesity: Secondary | ICD-10-CM | POA: Diagnosis not present

## 2023-06-12 DIAGNOSIS — O0993 Supervision of high risk pregnancy, unspecified, third trimester: Secondary | ICD-10-CM | POA: Diagnosis not present

## 2023-06-12 DIAGNOSIS — Z3A33 33 weeks gestation of pregnancy: Secondary | ICD-10-CM

## 2023-06-12 DIAGNOSIS — O24415 Gestational diabetes mellitus in pregnancy, controlled by oral hypoglycemic drugs: Secondary | ICD-10-CM | POA: Diagnosis not present

## 2023-06-12 DIAGNOSIS — O99212 Obesity complicating pregnancy, second trimester: Secondary | ICD-10-CM

## 2023-06-12 DIAGNOSIS — O0990 Supervision of high risk pregnancy, unspecified, unspecified trimester: Secondary | ICD-10-CM

## 2023-06-12 NOTE — Progress Notes (Signed)
   NURSE VISIT- NST  SUBJECTIVE:  Alyssa Morgan is a 27 y.o. G104P1011 female at [redacted]w[redacted]d, here for a NST for pregnancy complicated by Diabetes: A2DM} on Metformin and obesity. She reports active fetal movement, contractions: none, vaginal bleeding: none, membranes: intact.   OBJECTIVE:  LMP  (LMP Unknown) Comment: Pt has irregular periods.  Appears well, no apparent distress  No results found for this or any previous visit (from the past 24 hour(s)).  NST: FHR baseline 140 bpm, Variability: moderate, Accelerations:present, Decelerations:  Absent= Cat 1/reactive Toco: none   ASSESSMENT: G3P1011 at [redacted]w[redacted]d with Diabetes: A2DM} and Morbid obesity (BMI >=40) on Metformin NST reactive  PLAN: EFM strip reviewed by Dr. Despina Hidden   Recommendations: keep next appointment as scheduled    Jobe Marker  06/12/2023 4:16 PM

## 2023-06-16 ENCOUNTER — Ambulatory Visit: Payer: Medicaid Other | Admitting: Obstetrics & Gynecology

## 2023-06-16 ENCOUNTER — Other Ambulatory Visit: Payer: Medicaid Other

## 2023-06-16 ENCOUNTER — Encounter: Payer: Self-pay | Admitting: Obstetrics & Gynecology

## 2023-06-16 VITALS — BP 106/67 | HR 90 | Wt 277.0 lb

## 2023-06-16 DIAGNOSIS — O0993 Supervision of high risk pregnancy, unspecified, third trimester: Secondary | ICD-10-CM

## 2023-06-16 DIAGNOSIS — Z3A34 34 weeks gestation of pregnancy: Secondary | ICD-10-CM | POA: Diagnosis not present

## 2023-06-16 DIAGNOSIS — Z1389 Encounter for screening for other disorder: Secondary | ICD-10-CM

## 2023-06-16 DIAGNOSIS — O0990 Supervision of high risk pregnancy, unspecified, unspecified trimester: Secondary | ICD-10-CM

## 2023-06-16 DIAGNOSIS — Z331 Pregnant state, incidental: Secondary | ICD-10-CM

## 2023-06-16 LAB — POCT URINALYSIS DIPSTICK OB
Blood, UA: NEGATIVE
Glucose, UA: NEGATIVE
Ketones, UA: NEGATIVE
Leukocytes, UA: NEGATIVE
Nitrite, UA: NEGATIVE
POC,PROTEIN,UA: NEGATIVE

## 2023-06-16 NOTE — Progress Notes (Signed)
HIGH-RISK PREGNANCY VISIT Patient name: Alyssa Morgan MRN [id number]  Date of birth: 1996-05-15 Chief Complaint:   Routine Prenatal Visit and Non-stress Test  History of Present Illness:   Alyssa Flanigan is a 27 y.o. G1P1011 female at [redacted]w[redacted]d with an Estimated Date of Delivery: 07/26/23 being seen today for ongoing management of a high-risk pregnancy complicated by:  -GDMA2 Currently on MTF 1000mg  am, 500mg pm Sugars reviewed- much improved!!  Today she reports no complaints.   Contractions: Not present.  .  Movement: Present. denies leaking of fluid.      05/13/2023    3:28 PM  Depression screen PHQ 2/9  Decreased Interest 2  Down, Depressed, Hopeless 2  PHQ - 2 Score 4  Altered sleeping 3  Tired, decreased energy 3  Change in appetite 0  Feeling bad or failure about yourself  3  Trouble concentrating 0  Moving slowly or fidgety/restless 2  Suicidal thoughts 2  PHQ-9 Score 17     Current Outpatient Medications  Medication Instructions   Accu-Chek Softclix Lancets lancets Use as instructed to check blood sugar 4 times daily   aspirin EC 162 mg, Oral, Daily, Swallow whole.   Blood Glucose Monitoring Suppl (ACCU-CHEK GUIDE ME) w/Device KIT 1 each, Does not apply, 4 times daily   Blood Pressure Monitor MISC For regular home bp monitoring during pregnancy   glucose blood (ACCU-CHEK GUIDE) test strip Use as instructed to check blood sugar 4 times daily   metFORMIN (GLUCOPHAGE) 500 MG tablet Take 2 tablets at breakfast and 1 tablet at bedtime   nitrofurantoin (macrocrystal-monohydrate) (MACROBID) 100 mg, Oral, 2 times daily, X 7 days   Prenatal Vit w/Fe-Methylfol-FA (PNV PO) Oral     Review of Systems:   Pertinent items are noted in HPI Denies abnormal vaginal discharge w/ itching/odor/irritation, headaches, visual changes, shortness of breath, chest pain, abdominal pain, severe nausea/vomiting, or problems with urination or bowel movements unless otherwise stated  above. Pertinent History Reviewed:  Reviewed past medical,surgical, social, obstetrical and family history.  Reviewed problem list, medications and allergies. Physical Assessment:   Vitals:   06/16/23 1517  BP: 106/67  Pulse: 90  Weight: 277 lb (125.6 kg)  Body mass index is 49.07 kg/m.           Physical Examination:   General appearance: alert, well appearing, and in no distress  Mental status: normal mood, behavior, speech, dress, motor activity, and thought processes  Skin: warm & dry   Extremities: Edema: None    Cardiovascular: normal heart rate noted  Respiratory: normal respiratory effort, no distress  Abdomen: gravid, soft, non-tender  Pelvic: Cervical exam deferred         Fetal Status:     Movement: Present    Fetal Surveillance Testing today: NST  NST being performed due to GDMA2   Fetal Monitoring:  Baseline: 130 bpm, Variability: moderate, Accelerations: present, The accelerations are >15 bpm and more than 2 in 20 minutes, and Decelerations: Absent     Final diagnosis:  Reactive NST      Chaperone: N/A    Results for orders placed or performed in visit on 06/16/23 (from the past 24 hour(s))  POC Urinalysis Dipstick OB   Collection Time: 06/16/23  3:44 PM  Result Value Ref Range   Color, UA     Clarity, UA     Glucose, UA Negative Negative   Bilirubin, UA     Ketones, UA negative    Spec Grav, UA  Blood, UA negative    pH, UA     POC,PROTEIN,UA Negative Negative, Trace, Small (1+), Moderate (2+), Large (3+), 4+   Urobilinogen, UA     Nitrite, UA negative    Leukocytes, UA Negative Negative   Appearance     Odor       Assessment & Plan:  High-risk pregnancy: G3P1011 at [redacted]w[redacted]d with an Estimated Date of Delivery: 07/26/23   1) GDMA2 -congratulated pt on improvement of sugars!! -continue with current meds -reactive NST, continue twice weekly testing  2) prior C-section Continue with plan for repeat & BTL  Meds: No orders of the defined  types were placed in this encounter.   Labs/procedures today: NST  Treatment Plan:  routine OB care as outlined above  Reviewed: Preterm labor symptoms and general obstetric precautions including but not limited to vaginal bleeding, contractions, leaking of fluid and fetal movement were reviewed in detail with the patient.  All questions were answered. Pt has home bp cuff. Check bp weekly, let us know if >140/90.   Follow-up: Return in about 1 week (around 06/23/2023) for twice weekly as scheduled.   Future Appointments  Date Time Provider Department Center  06/19/2023  3:30 PM [facility] NURSE [facility]  06/23/2023  3:30 PM Jacklyn Shell, CNM [facility]  06/23/2023  3:30 PM [facility] NURSE [facility]  06/26/2023  3:10 PM [facility] NURSE [facility]  07/01/2023  3:45 PM [facility] [place] [facility] None  07/01/2023  4:30 PM Lazaro Arms, MD [facility]  07/04/2023 11:10 AM [facility] NURSE [facility]  07/07/2023  3:30 PM [facility] NURSE [facility]  07/07/2023  3:50 PM Myna Hidalgo, DO [facility]  07/10/2023  3:30 PM [facility] NURSE [facility]  07/14/2023  3:30 PM [facility] NURSE [facility]  07/14/2023  3:50 PM Cheral Marker, CNM [facility]  07/17/2023 11:10 AM [facility] NURSE [facility]  07/21/2023  3:10 PM [facility] NURSE [facility]  07/21/2023  3:30 PM Jacklyn Shell, CNM [facility]  07/24/2023  3:30 PM [facility] NURSE [facility]    Orders Placed This Encounter  Procedures   POC Urinalysis Dipstick OB    Myna Hidalgo, DO Attending Obstetrician & Gynecologist, Faculty Practice Center for [employer], [facility]

## 2023-06-18 ENCOUNTER — Telehealth: Payer: Self-pay | Admitting: Clinical

## 2023-06-18 NOTE — Telephone Encounter (Signed)
Attempt call regarding referral; Left HIPPA-compliant message to call back Jamie from [facility] at  [phone] (Jamie's office).    

## 2023-06-19 ENCOUNTER — Ambulatory Visit (INDEPENDENT_AMBULATORY_CARE_PROVIDER_SITE_OTHER): Payer: Medicaid Other | Admitting: *Deleted

## 2023-06-19 VITALS — BP 120/79 | HR 102 | Wt 274.0 lb

## 2023-06-19 DIAGNOSIS — Z3A34 34 weeks gestation of pregnancy: Secondary | ICD-10-CM | POA: Diagnosis not present

## 2023-06-19 DIAGNOSIS — O288 Other abnormal findings on antenatal screening of mother: Secondary | ICD-10-CM

## 2023-06-19 DIAGNOSIS — O24415 Gestational diabetes mellitus in pregnancy, controlled by oral hypoglycemic drugs: Secondary | ICD-10-CM

## 2023-06-19 DIAGNOSIS — Z1389 Encounter for screening for other disorder: Secondary | ICD-10-CM

## 2023-06-19 DIAGNOSIS — O0993 Supervision of high risk pregnancy, unspecified, third trimester: Secondary | ICD-10-CM

## 2023-06-19 DIAGNOSIS — Z331 Pregnant state, incidental: Secondary | ICD-10-CM

## 2023-06-19 DIAGNOSIS — O0990 Supervision of high risk pregnancy, unspecified, unspecified trimester: Secondary | ICD-10-CM

## 2023-06-19 LAB — POCT URINALYSIS DIPSTICK OB
Blood, UA: NEGATIVE
Glucose, UA: NEGATIVE
Leukocytes, UA: NEGATIVE
Nitrite, UA: NEGATIVE

## 2023-06-19 NOTE — Progress Notes (Addendum)
   NURSE VISIT- NST  SUBJECTIVE:  Alyssa Morgan is a 27 y.o. G62P1011 female at [redacted]w[redacted]d, here for a NST for pregnancy complicated by Diabetes: A2DM} and Morbid obesity (BMI >=40).  She reports active fetal movement, contractions: none, vaginal bleeding: none, membranes: intact.   OBJECTIVE:  BP 120/79   Pulse (!) 102   Wt 274 lb (124.3 kg)   LMP  (LMP Unknown) Comment: Pt has irregular periods.  BMI 48.54 kg/m   Appears well, no apparent distress  Results for orders placed or performed in visit on 06/19/23 (from the past 24 hour(s))  POC Urinalysis Dipstick OB   Collection Time: 06/19/23  3:19 PM  Result Value Ref Range   Color, UA     Clarity, UA     Glucose, UA Negative Negative   Bilirubin, UA     Ketones, UA trace    Spec Grav, UA     Blood, UA negative    pH, UA     POC,PROTEIN,UA Small (1+) Negative, Trace, Small (1+), Moderate (2+), Large (3+), 4+   Urobilinogen, UA     Nitrite, UA negative    Leukocytes, UA Negative Negative   Appearance     Odor      NST: FHR baseline 140 bpm, Variability: moderate, Accelerations:present, Decelerations:  Absent= Cat 1/reactive Toco: none   ASSESSMENT: G3P1011 at [redacted]w[redacted]d with Diabetes: A2DM} and Morbid obesity (BMI >=40) NST reactive  PLAN: EFM strip reviewed by Cathie Beams, CNM   Recommendations: keep next appointment as scheduled    Jobe Marker  06/19/2023 4:52 PM  Chart reviewed for nurse visit. Agree with plan of care.  Jacklyn Shell, [place] 06/19/2023 7:41 PM

## 2023-06-23 ENCOUNTER — Ambulatory Visit (INDEPENDENT_AMBULATORY_CARE_PROVIDER_SITE_OTHER): Payer: Medicaid Other | Admitting: Advanced Practice Midwife

## 2023-06-23 ENCOUNTER — Other Ambulatory Visit: Payer: Medicaid Other

## 2023-06-23 ENCOUNTER — Encounter: Payer: Self-pay | Admitting: Advanced Practice Midwife

## 2023-06-23 VITALS — BP 132/87 | HR 99 | Wt 270.8 lb

## 2023-06-23 DIAGNOSIS — Z3A35 35 weeks gestation of pregnancy: Secondary | ICD-10-CM

## 2023-06-23 DIAGNOSIS — O24415 Gestational diabetes mellitus in pregnancy, controlled by oral hypoglycemic drugs: Secondary | ICD-10-CM

## 2023-06-23 DIAGNOSIS — O0993 Supervision of high risk pregnancy, unspecified, third trimester: Secondary | ICD-10-CM

## 2023-06-23 DIAGNOSIS — Z331 Pregnant state, incidental: Secondary | ICD-10-CM

## 2023-06-23 DIAGNOSIS — Z98891 History of uterine scar from previous surgery: Secondary | ICD-10-CM

## 2023-06-23 DIAGNOSIS — O0990 Supervision of high risk pregnancy, unspecified, unspecified trimester: Secondary | ICD-10-CM

## 2023-06-23 DIAGNOSIS — O99213 Obesity complicating pregnancy, third trimester: Secondary | ICD-10-CM

## 2023-06-23 LAB — POCT URINALYSIS DIPSTICK OB
Blood, UA: NEGATIVE
Glucose, UA: NEGATIVE
Ketones, UA: NEGATIVE
Leukocytes, UA: NEGATIVE
Nitrite, UA: NEGATIVE
POC,PROTEIN,UA: NEGATIVE

## 2023-06-23 MED ORDER — FUROSEMIDE 20 MG PO TABS: 20.0000 mg | ORAL_TABLET | Freq: Every day | ORAL | 0 refills | Status: DC

## 2023-06-23 NOTE — Patient Instructions (Signed)
[place], I greatly value your feedback.  If you receive a survey following your visit with [place] today, we appreciate you taking the time to fill it out.  Thanks, Cathie Beams, DNP, CNM  [facility] HAS MOVED!!! It is now [facility] ([address] [place], [place] [address]) Entrance located off of [address] Free 24/7 valet parking   Go to [email or web address] to register for FREE online childbirth classes    Call the office [phone]) or go to [facility] if: You begin to have strong, frequent contractions Your water breaks.  Sometimes it is a big gush of fluid, sometimes it is just a trickle that keeps getting your panties wet or running down your legs You have vaginal bleeding.  It is normal to have a small amount of spotting if your cervix was checked.  You don't feel your baby moving like normal.  If you don't, get you something to eat and drink and lay down and focus on feeling your baby move.  You should feel at least 10 movements in 2 hours.  If you don't, you should call the office or go to [facility].   Home Blood Pressure Monitoring for Patients   Your provider has recommended that you check your blood pressure (BP) at least once a week at home. If you do not have a blood pressure cuff at home, one will be provided for you. Contact your provider if you have not received your monitor within 1 week.   Helpful Tips for Accurate Home Blood Pressure Checks  Don't smoke, exercise, or drink caffeine 30 minutes before checking your BP Use the restroom before checking your BP (a full bladder can raise your pressure) Relax in a comfortable upright chair Feet on the ground Left arm resting comfortably on a flat surface at the level of your heart Legs uncrossed Back supported Sit quietly and don't talk Place the cuff on your bare arm Adjust snuggly, so that only two fingertips can fit between your skin and the top of  the cuff Check 2 readings separated by at least one minute Keep a log of your BP readings For a visual, please reference this diagram: http://ccnc.care/bpdiagram  Provider Name: [facility]     Phone: [phone]  Zone 1: ALL CLEAR  Continue to monitor your symptoms:  BP reading is less than 140 (top number) or less than 90 (bottom number)  No right upper stomach pain No headaches or seeing spots No feeling nauseated or throwing up No swelling in face and hands  Zone 2: CAUTION Call your doctor's office for any of the following:  BP reading is greater than 140 (top number) or greater than 90 (bottom number)  Stomach pain under your ribs in the middle or right side Headaches or seeing spots Feeling nauseated or throwing up Swelling in face and hands  Zone 3: EMERGENCY  Seek immediate medical care if you have any of the following:  BP reading is greater than160 (top number) or greater than 110 (bottom number) Severe headaches not improving with Tylenol Serious difficulty catching your breath Any worsening symptoms from Zone 2

## 2023-06-23 NOTE — Progress Notes (Signed)
HIGH-RISK PREGNANCY VISIT Patient name: Alyssa Morgan MRN [id number]  Date of birth: Jul 13, 1996 Chief Complaint:   Routine Prenatal Visit (NST)  History of Present Illness:   Alyssa Morgan is a 27 y.o. G34P1011 female at [redacted]w[redacted]d with an Estimated Date of Delivery: 07/26/23 being seen today for ongoing management of a high-risk pregnancy complicated by diabetes mellitus A2DM currently on metformin 1000mg  qam and 500mg  q pm  and morbid obesity BMI >=40.    Today she reports no complaints. ALL BLOOD SUGARS IN RANGE. Contractions: Not present.  .  Movement: Present. denies leaking of fluid.      05/13/2023    3:28 PM  Depression screen PHQ 2/9  Decreased Interest 2  Down, Depressed, Hopeless 2  PHQ - 2 Score 4  Altered sleeping 3  Tired, decreased energy 3  Change in appetite 0  Feeling bad or failure about yourself  3  Trouble concentrating 0  Moving slowly or fidgety/restless 2  Suicidal thoughts 2  PHQ-9 Score 17        05/13/2023    3:28 PM  GAD 7 : Generalized Anxiety Score  Nervous, Anxious, on Edge 3  Control/stop worrying 3  Worry too much - different things 3  Trouble relaxing 3  Restless 3  Easily annoyed or irritable 3  Afraid - awful might happen 3  Total GAD 7 Score 21     Review of Systems:   Pertinent items are noted in HPI Denies abnormal vaginal discharge w/ itching/odor/irritation, headaches, visual changes, shortness of breath, chest pain, abdominal pain, severe nausea/vomiting, or problems with urination or bowel movements unless otherwise stated above. Pertinent History Reviewed:  Reviewed past medical,surgical, social, obstetrical and family history.  Reviewed problem list, medications and allergies. Physical Assessment:   Vitals:   06/23/23 1500  BP: 132/87  Pulse: 99  Weight: 270 lb 12.8 oz (122.8 kg)  Body mass index is 47.97 kg/m.           Physical Examination:   General appearance: alert, well appearing, and in no distress  Mental  status: alert, oriented to person, place, and time  Skin: warm & dry   Extremities: Edema: None    Cardiovascular: normal heart rate noted  Respiratory: normal respiratory effort, no distress  Abdomen: gravid, soft, non-tender  Pelvic: Cervical exam deferred         Fetal Status:     Movement: Present    Fetal Surveillance Testing today: NST: FHR baseline 150 bpm, Variability: moderate, Accelerations:present, Decelerations:  Absent= Cat 1/Reactive    Chaperone: N/A    Results for orders placed or performed in visit on 06/23/23 (from the past 24 hour(s))  POC Urinalysis Dipstick OB   Collection Time: 06/23/23  4:09 PM  Result Value Ref Range   Color, UA     Clarity, UA     Glucose, UA Negative Negative   Bilirubin, UA     Ketones, UA negative    Spec Grav, UA     Blood, UA negative    pH, UA     POC,PROTEIN,UA Negative Negative, Trace, Small (1+), Moderate (2+), Large (3+), 4+   Urobilinogen, UA     Nitrite, UA negative    Leukocytes, UA Negative Negative   Appearance     Odor      Assessment & Plan:  High-risk pregnancy: G3P1011 at [redacted]w[redacted]d with an Estimated Date of Delivery: 07/26/23   1. Supervision of high risk pregnancy, antepartum   2. Pregnant  state, incidental   3. Severe obesity due to excess calories affecting pregnancy in second trimester (HCC) Testing per DM guidelines  4. Gestational diabetes mellitus (GDM) in third trimester controlled on oral hypoglycemic drug Continue MFT 1000/500  5. History of cesarean delivery Plans repeat    Meds:  Meds ordered this encounter  Medications   DISCONTD: furosemide (LASIX) 20 MG tablet    Sig: Take 1 tablet (20 mg total) by mouth daily.    Dispense:  5 tablet    Refill:  0    Order Specific Question:   Supervising Provider    Answer:   Duane Lope H [[id number]]    Orders:  Orders Placed This Encounter  Procedures   POC Urinalysis Dipstick OB     Labs/procedures today: none   Reviewed: Term labor  symptoms and general obstetric precautions including but not limited to vaginal bleeding, contractions, leaking of fluid and fetal movement were reviewed in detail with the patient.  All questions were answered. Does have home bp cuff. Office bp cuff given: not applicable. Check bp weekly, let us know if consistently >140 and/or >90.  Follow-up: No follow-ups on file.   Future Appointments  Date Time Provider Department Center  06/26/2023  3:10 PM [facility] NURSE [facility]  07/01/2023  3:45 PM [facility] [place] [facility] None  07/01/2023  4:30 PM Lazaro Arms, MD [facility]  07/04/2023 11:10 AM [facility] NURSE [facility]  07/07/2023  3:30 PM [facility] NURSE [facility]  07/07/2023  3:50 PM Myna Hidalgo, DO [facility]  07/10/2023  3:30 PM [facility] NURSE [facility]  07/14/2023  3:30 PM [facility] NURSE [facility]  07/14/2023  3:50 PM Cheral Marker, CNM [facility]  07/17/2023 11:10 AM [facility] NUR[facility]  07/21/2023  3:10 PM [facility]  07/21/2023  3:30 PM Jacklyn Shell, CNM [facility]  07/24/2023  3:30 PM [facility] NURSE [facility]    Orders Placed This Encounter  Procedures   POC Urinalysis Dipstick OB   Jacklyn Shell , DNP, CNM [facility] 06/23/2023 4:24 PM

## 2023-06-26 ENCOUNTER — Ambulatory Visit (INDEPENDENT_AMBULATORY_CARE_PROVIDER_SITE_OTHER): Payer: Medicaid Other | Admitting: *Deleted

## 2023-06-26 VITALS — BP 115/80 | HR 108 | Wt 267.0 lb

## 2023-06-26 DIAGNOSIS — O0993 Supervision of high risk pregnancy, unspecified, third trimester: Secondary | ICD-10-CM

## 2023-06-26 DIAGNOSIS — O24415 Gestational diabetes mellitus in pregnancy, controlled by oral hypoglycemic drugs: Secondary | ICD-10-CM

## 2023-06-26 DIAGNOSIS — O288 Other abnormal findings on antenatal screening of mother: Secondary | ICD-10-CM

## 2023-06-26 DIAGNOSIS — Z3A35 35 weeks gestation of pregnancy: Secondary | ICD-10-CM

## 2023-06-26 DIAGNOSIS — Z1389 Encounter for screening for other disorder: Secondary | ICD-10-CM

## 2023-06-26 DIAGNOSIS — Z331 Pregnant state, incidental: Secondary | ICD-10-CM

## 2023-06-26 DIAGNOSIS — O0990 Supervision of high risk pregnancy, unspecified, unspecified trimester: Secondary | ICD-10-CM

## 2023-06-26 LAB — POCT URINALYSIS DIPSTICK OB
Blood, UA: NEGATIVE
Glucose, UA: NEGATIVE
Leukocytes, UA: NEGATIVE
Nitrite, UA: NEGATIVE

## 2023-06-26 NOTE — Progress Notes (Signed)
   NURSE VISIT- NST  SUBJECTIVE:  Alyssa Morgan is a 27 y.o. G4P1011 female at [redacted]w[redacted]d, here for a NST for pregnancy complicated by Diabetes: A2DM} and Morbid obesity (BMI >=40) on Metformin.  She reports active fetal movement, contractions: none, vaginal bleeding: none, membranes: intact.   OBJECTIVE:  BP 115/80   Pulse (!) 108   Wt 267 lb (121.1 kg)   LMP  (LMP Unknown) Comment: Pt has irregular periods.  BMI 47.30 kg/m   Appears well, no apparent distress  No results found for this or any previous visit (from the past 24 hour(s)).  NST: FHR baseline 145 bpm, Variability: moderate, Accelerations:present, Decelerations:  Absent= Cat 1/reactive Toco: none   ASSESSMENT: G3P1011 at [redacted]w[redacted]d with Diabetes: A2DM} and Morbid obesity (BMI >=40) on Metformin NST reactive  PLAN: EFM strip reviewed by Dr. Charlotta Newton   Recommendations: keep next appointment as scheduled    Jobe Marker  06/26/2023 4:17 PM

## 2023-06-30 ENCOUNTER — Other Ambulatory Visit: Payer: Self-pay | Admitting: Women's Health

## 2023-06-30 DIAGNOSIS — O0993 Supervision of high risk pregnancy, unspecified, third trimester: Secondary | ICD-10-CM

## 2023-06-30 DIAGNOSIS — O24419 Gestational diabetes mellitus in pregnancy, unspecified control: Secondary | ICD-10-CM

## 2023-07-01 ENCOUNTER — Ambulatory Visit (INDEPENDENT_AMBULATORY_CARE_PROVIDER_SITE_OTHER): Payer: Medicaid Other | Admitting: Obstetrics & Gynecology

## 2023-07-01 ENCOUNTER — Encounter: Payer: Self-pay | Admitting: Obstetrics & Gynecology

## 2023-07-01 ENCOUNTER — Other Ambulatory Visit: Payer: Self-pay | Admitting: Women's Health

## 2023-07-01 ENCOUNTER — Other Ambulatory Visit (HOSPITAL_COMMUNITY)
Admission: RE | Admit: 2023-07-01 | Discharge: 2023-07-01 | Disposition: A | Discharge status: Home / Self Care | Payer: Medicaid Other | Source: Ambulatory Visit | Attending: Obstetrics & Gynecology | Admitting: Obstetrics & Gynecology

## 2023-07-01 ENCOUNTER — Ambulatory Visit (INDEPENDENT_AMBULATORY_CARE_PROVIDER_SITE_OTHER): Payer: Medicaid Other

## 2023-07-01 ENCOUNTER — Other Ambulatory Visit: Payer: Self-pay | Admitting: Obstetrics & Gynecology

## 2023-07-01 VITALS — BP 132/84 | HR 83 | Wt 282.0 lb

## 2023-07-01 DIAGNOSIS — Z3A36 36 weeks gestation of pregnancy: Secondary | ICD-10-CM | POA: Insufficient documentation

## 2023-07-01 DIAGNOSIS — O0993 Supervision of high risk pregnancy, unspecified, third trimester: Secondary | ICD-10-CM

## 2023-07-01 DIAGNOSIS — O09299 Supervision of pregnancy with other poor reproductive or obstetric history, unspecified trimester: Secondary | ICD-10-CM

## 2023-07-01 DIAGNOSIS — O24419 Gestational diabetes mellitus in pregnancy, unspecified control: Secondary | ICD-10-CM

## 2023-07-01 DIAGNOSIS — Z3A29 29 weeks gestation of pregnancy: Secondary | ICD-10-CM

## 2023-07-01 DIAGNOSIS — Z98891 History of uterine scar from previous surgery: Secondary | ICD-10-CM

## 2023-07-01 DIAGNOSIS — Z8679 Personal history of other diseases of the circulatory system: Secondary | ICD-10-CM

## 2023-07-01 DIAGNOSIS — O0990 Supervision of high risk pregnancy, unspecified, unspecified trimester: Secondary | ICD-10-CM

## 2023-07-01 DIAGNOSIS — Z3480 Encounter for supervision of other normal pregnancy, unspecified trimester: Secondary | ICD-10-CM

## 2023-07-01 DIAGNOSIS — O0933 Supervision of pregnancy with insufficient antenatal care, third trimester: Secondary | ICD-10-CM

## 2023-07-01 DIAGNOSIS — F418 Other specified anxiety disorders: Secondary | ICD-10-CM

## 2023-07-01 DIAGNOSIS — O24415 Gestational diabetes mellitus in pregnancy, controlled by oral hypoglycemic drugs: Secondary | ICD-10-CM

## 2023-07-01 NOTE — Progress Notes (Signed)
HIGH-RISK PREGNANCY VISIT Patient name: Alyssa Morgan MRN [id number]  Date of birth: [birth year] Chief Complaint:   Routine Prenatal Visit  History of Present Illness:   Alyssa Frutiger is a 27 y.o. G37P1011 female at [redacted]w[redacted]d with an Estimated Date of Delivery: 07/26/23 being seen today for ongoing management of a high-risk pregnancy complicated by A2DM on metformin 1000 mg am, 500 mg pm with good control.    Today she reports no complaints. Contractions: Not present. Vag. Bleeding: None.  Movement: Present. denies leaking of fluid.      05/13/2023    3:28 PM  Depression screen PHQ 2/9  Decreased Interest 2  Down, Depressed, Hopeless 2  PHQ - 2 Score 4  Altered sleeping 3  Tired, decreased energy 3  Change in appetite 0  Feeling bad or failure about yourself  3  Trouble concentrating 0  Moving slowly or fidgety/restless 2  Suicidal thoughts 2  PHQ-9 Score 17        05/13/2023    3:28 PM  GAD 7 : Generalized Anxiety Score  Nervous, Anxious, on Edge 3  Control/stop worrying 3  Worry too much - different things 3  Trouble relaxing 3  Restless 3  Easily annoyed or irritable 3  Afraid - awful might happen 3  Total GAD 7 Score 21     Review of Systems:   Pertinent items are noted in HPI Denies abnormal vaginal discharge w/ itching/odor/irritation, headaches, visual changes, shortness of breath, chest pain, abdominal pain, severe nausea/vomiting, or problems with urination or bowel movements unless otherwise stated above. Pertinent History Reviewed:  Reviewed past medical,surgical, social, obstetrical and family history.  Reviewed problem list, medications and allergies. Physical Assessment:   Vitals:   07/01/23 1628  BP: 132/84  Pulse: 83  Weight: 282 lb (127.9 kg)  Body mass index is 49.95 kg/m.           Physical Examination:   General appearance: alert, well appearing, and in no distress  Mental status: alert, oriented to person, place, and time  Skin: warm & dry    Extremities:      Cardiovascular: normal heart rate noted  Respiratory: normal respiratory effort, no distress  Abdomen: gravid, soft, non-tender  Pelvic: Cervical exam deferred         Fetal Status:     Movement: Present    Fetal Surveillance Testing today: BPP 6/8 with reactive NST   Chaperone: Faith Rogue    No results found for this or any previous visit (from the past 24 hour(s)).  Assessment & Plan:  High-risk pregnancy: G3P1011 at [redacted]w[redacted]d with an Estimated Date of Delivery: 07/26/23      ICD-10-CM   1. Supervision of high risk pregnancy, antepartum  O09.90 Culture, beta strep (group b only)    Cervicovaginal ancillary only( [place])    2. Gestational diabetes mellitus (GDM) in third trimester controlled on oral hypoglycemic drug  O24.415     3. [redacted] weeks gestation of pregnancy  Z3A.36 Culture, beta strep (group b only)    Cervicovaginal ancillary only( [place])        Meds: No orders of the defined types were placed in this encounter.   Orders:  Orders Placed This Encounter  Procedures   Culture, beta strep (group b only)     Labs/procedures today: U/S  Treatment Plan:  twice weekly surveillance    Follow-up: No follow-ups on file.   Future Appointments  Date Time Provider Department Center  07/04/2023 11:10 AM [facility] NURSE [facility]  07/07/2023  3:30 PM [facility]UR[facility]  07/07/2023  3:50 PM Myna Hidalgo, DO [facility]  07/10/2023  3:30 PM [facility] NURSE [facility]  07/14/2023  3:30 PM [facility] NUR[facility]  07/14/2023  3:50 PM Cheral Marker, CNM [facility]  07/17/2023 11:10 AM [facility] NURSE [facility]  07/21/2023  3:10 PM [facility] NURSE [facility]  07/21/2023  3:30 PM Cresenzo-Dishmon, Scarlette Calico, CNM [facility]  07/24/2023  3:30 PM [facility] NURSE [facility]    Orders Placed This Encounter  Procedures   Culture, beta strep (group b only)   Lazaro Arms  Attending  Physician for the [facility] 07/01/2023 5:20 PM

## 2023-07-01 NOTE — Progress Notes (Signed)
[place] 36+3 wks,cephalic,BPP 6/8 no breathing,FHR 137 bpm,anterior placenta gr 3,AFI 20 cm,EFW 2816 g 41%,limited view

## 2023-07-04 ENCOUNTER — Ambulatory Visit (INDEPENDENT_AMBULATORY_CARE_PROVIDER_SITE_OTHER): Payer: Medicaid Other

## 2023-07-04 VITALS — BP 120/82 | HR 96 | Wt 286.4 lb

## 2023-07-04 DIAGNOSIS — Z3A36 36 weeks gestation of pregnancy: Secondary | ICD-10-CM

## 2023-07-04 DIAGNOSIS — O288 Other abnormal findings on antenatal screening of mother: Secondary | ICD-10-CM

## 2023-07-04 DIAGNOSIS — Z1389 Encounter for screening for other disorder: Secondary | ICD-10-CM

## 2023-07-04 DIAGNOSIS — Z331 Pregnant state, incidental: Secondary | ICD-10-CM

## 2023-07-04 LAB — POCT URINALYSIS DIPSTICK OB
Blood, UA: NEGATIVE
Ketones, UA: NEGATIVE
Leukocytes, UA: NEGATIVE
Nitrite, UA: NEGATIVE
POC,PROTEIN,UA: NEGATIVE

## 2023-07-04 NOTE — Progress Notes (Signed)
   NURSE VISIT- NST  SUBJECTIVE:  Alyssa Morgan is a 27 y.o. G40P1011 female at [redacted]w[redacted]d, here for a NST for pregnancy complicated by Diabetes: A2DM} and Morbid obesity (BMI >=40) on Metformin.  She reports active fetal movement, contractions: none, vaginal bleeding: none, membranes: intact.   OBJECTIVE:  BP 120/82   Pulse 96   Wt 286 lb 6.4 oz (129.9 kg)   LMP  (LMP Unknown) Comment: Pt has irregular periods.  BMI 50.73 kg/m   Appears well, no apparent distress  Results for orders placed or performed in visit on 07/04/23 (from the past 24 hour(s))  POC Urinalysis Dipstick OB   Collection Time: 07/04/23 12:02 PM  Result Value Ref Range   Color, UA     Clarity, UA     Glucose, UA Moderate (2+) (A) Negative   Bilirubin, UA     Ketones, UA negative    Spec Grav, UA     Blood, UA negative    pH, UA     POC,PROTEIN,UA Negative Negative, Trace, Small (1+), Moderate (2+), Large (3+), 4+   Urobilinogen, UA     Nitrite, UA negative    Leukocytes, UA Negative Negative   Appearance     Odor      NST: FHR baseline 135 bpm, Variability: moderate, Accelerations:present, Decelerations:  Absent= Cat 1/reactive Toco: none   ASSESSMENT: G3P1011 at [redacted]w[redacted]d with Diabetes: A2DM} and Morbid obesity (BMI >=40) NST reactive  PLAN: EFM strip reviewed by Dr. Despina Hidden   Recommendations: keep next appointment as scheduled    Caralyn Guile  07/04/2023 12:30 PM

## 2023-07-07 ENCOUNTER — Other Ambulatory Visit: Payer: Medicaid Other

## 2023-07-07 ENCOUNTER — Encounter: Payer: Self-pay | Admitting: Obstetrics & Gynecology

## 2023-07-07 ENCOUNTER — Ambulatory Visit (INDEPENDENT_AMBULATORY_CARE_PROVIDER_SITE_OTHER): Payer: Medicaid Other | Admitting: Obstetrics & Gynecology

## 2023-07-07 VITALS — BP 129/88 | HR 96 | Wt 287.3 lb

## 2023-07-07 DIAGNOSIS — O0993 Supervision of high risk pregnancy, unspecified, third trimester: Secondary | ICD-10-CM | POA: Diagnosis not present

## 2023-07-07 DIAGNOSIS — Z1389 Encounter for screening for other disorder: Secondary | ICD-10-CM

## 2023-07-07 DIAGNOSIS — Z3A37 37 weeks gestation of pregnancy: Secondary | ICD-10-CM | POA: Diagnosis not present

## 2023-07-07 DIAGNOSIS — Z331 Pregnant state, incidental: Secondary | ICD-10-CM

## 2023-07-07 DIAGNOSIS — O0990 Supervision of high risk pregnancy, unspecified, unspecified trimester: Secondary | ICD-10-CM

## 2023-07-07 LAB — POCT URINALYSIS DIPSTICK OB
Blood, UA: NEGATIVE
Glucose, UA: NEGATIVE
Ketones, UA: NEGATIVE
Leukocytes, UA: NEGATIVE
Nitrite, UA: NEGATIVE
POC,PROTEIN,UA: NEGATIVE

## 2023-07-07 NOTE — Progress Notes (Signed)
HIGH-RISK PREGNANCY VISIT Patient name: Alyssa Morgan MRN [id number]  Date of birth: [birth year] Chief Complaint:   Routine Prenatal Visit and Non-stress Test (Think she losing urine)  History of Present Illness:   Alyssa Morgan is a 27 y.o. G17P1011 female at [redacted]w[redacted]d with an Estimated Date of Delivery: 07/26/23 being seen today for ongoing management of a high-risk pregnancy complicated by:  -GDMA2- did not bring log, per pt sugars are "ok" -Prior C-section- desires repeat with sterilization  Today she reports  occasional episodes of urinary incontinence .   Contractions: Not present. Denies vaginal bleeding .  Movement: Present. denies leaking of fluid.      05/13/2023    3:28 PM  Depression screen PHQ 2/9  Decreased Interest 2  Down, Depressed, Hopeless 2  PHQ - 2 Score 4  Altered sleeping 3  Tired, decreased energy 3  Change in appetite 0  Feeling bad or failure about yourself  3  Trouble concentrating 0  Moving slowly or fidgety/restless 2  Suicidal thoughts 2  PHQ-9 Score 17     Current Outpatient Medications  Medication Instructions   Accu-Chek Softclix Lancets lancets Use as instructed to check blood sugar 4 times daily   aspirin EC 162 mg, Oral, Daily, Swallow whole.   Blood Glucose Monitoring Suppl (ACCU-CHEK GUIDE ME) w/Device KIT 1 each, Does not apply, 4 times daily   Blood Pressure Monitor MISC For regular home bp monitoring during pregnancy   glucose blood (ACCU-CHEK GUIDE) test strip Use as instructed to check blood sugar 4 times daily   metFORMIN (GLUCOPHAGE) 500 MG tablet Take 2 tablets at breakfast and 1 tablet at bedtime   nitrofurantoin (macrocrystal-monohydrate) (MACROBID) 100 mg, Oral, 2 times daily, X 7 days   Prenatal Vit w/Fe-Methylfol-FA (PNV PO) Oral     Review of Systems:   Pertinent items are noted in HPI Denies abnormal vaginal discharge w/ itching/odor/irritation, headaches, visual changes, shortness of breath, chest pain, abdominal pain,  severe nausea/vomiting, or problems with urination or bowel movements unless otherwise stated above. Pertinent History Reviewed:  Reviewed past medical,surgical, social, obstetrical and family history.  Reviewed problem list, medications and allergies. Physical Assessment:   Vitals:   07/07/23 1600  BP: 129/88  Pulse: 96  Weight: 287 lb 4.8 oz (130.3 kg)  Body mass index is 50.89 kg/m.           Physical Examination:   General appearance: alert, well appearing, and in no distress  Mental status: normal mood, behavior, speech, dress, motor activity, and thought processes  Skin: warm & dry   Extremities: Edema: Trace    Cardiovascular: normal heart rate noted  Respiratory: normal respiratory effort, no distress  Abdomen: gravid, soft, non-tender  Pelvic: Cervical exam deferred         Fetal Status:     Movement: Present    Fetal Surveillance Testing today: NST  NST being performed due to GDMA2   Fetal Monitoring:  Baseline: 150 bpm, Variability: moderate, Accelerations: present, The accelerations are >15 bpm and more than 2 in 20 minutes, and Decelerations: Absent     Final diagnosis:  Reactive NST      Chaperone: N/A    Results for orders placed or performed in visit on 07/07/23 (from the past 24 hour(s))  POC Urinalysis Dipstick OB   Collection Time: 07/07/23  4:31 PM  Result Value Ref Range   Color, UA     Clarity, UA     Glucose, UA Negative Negative  Bilirubin, UA     Ketones, UA negative    Spec Grav, UA     Blood, UA negative    pH, UA     POC,PROTEIN,UA Negative Negative, Trace, Small (1+), Moderate (2+), Large (3+), 4+   Urobilinogen, UA     Nitrite, UA negative    Leukocytes, UA Negative Negative   Appearance     Odor       Assessment & Plan:  High-risk pregnancy: G3P1011 at [redacted]w[redacted]d with an Estimated Date of Delivery: 07/26/23   1) GDMA2 -again reminded pt importance of bring log every visit -continue antepartum testing -reviewed plan for  delivery @ 39wk  2) Prior C_section -message sent to schedule Repeat CD and salpingectomy for 8/19  Meds: No orders of the defined types were placed in this encounter.   Labs/procedures today: NST  Treatment Plan:  as outlined above  Reviewed: Preterm labor symptoms and general obstetric precautions including but not limited to vaginal bleeding, contractions, leaking of fluid and fetal movement were reviewed in detail with the patient.  All questions were answered. Pt has home bp cuff. Check bp weekly, let us know if >140/90.   Follow-up: Return for follow up as scheduled.   Future Appointments  Date Time Provider Department Center  07/10/2023  3:30 PM [facility] NURSE [facility]  07/14/2023  3:30 PM [facility] NURSE [facility]  07/14/2023  3:50 PM Cheral Marker, CNM [facility]  07/17/2023 11:10 AM [facility] NURSE [facility]  07/21/2023  3:10 PM [facility] NURSE [facility]  07/21/2023  3:30 PM Jacklyn Shell, CNM [facility]  07/24/2023  3:30 PM [facility] NURSE [facility]    Orders Placed This Encounter  Procedures   POC Urinalysis Dipstick OB    Myna Hidalgo, DO Attending Obstetrician & Gynecologist, [facility] Practice [facility] for [employer], [facility]

## 2023-07-10 ENCOUNTER — Telehealth (HOSPITAL_COMMUNITY): Payer: Self-pay | Admitting: *Deleted

## 2023-07-10 ENCOUNTER — Other Ambulatory Visit: Payer: Medicaid Other

## 2023-07-10 ENCOUNTER — Encounter (HOSPITAL_COMMUNITY): Payer: Self-pay

## 2023-07-10 ENCOUNTER — Ambulatory Visit (INDEPENDENT_AMBULATORY_CARE_PROVIDER_SITE_OTHER): Payer: Medicaid Other

## 2023-07-10 VITALS — BP 133/85 | HR 96 | Wt 289.0 lb

## 2023-07-10 DIAGNOSIS — O24415 Gestational diabetes mellitus in pregnancy, controlled by oral hypoglycemic drugs: Secondary | ICD-10-CM | POA: Diagnosis not present

## 2023-07-10 DIAGNOSIS — O0990 Supervision of high risk pregnancy, unspecified, unspecified trimester: Secondary | ICD-10-CM

## 2023-07-10 DIAGNOSIS — R030 Elevated blood-pressure reading, without diagnosis of hypertension: Secondary | ICD-10-CM | POA: Diagnosis not present

## 2023-07-10 DIAGNOSIS — O0993 Supervision of high risk pregnancy, unspecified, third trimester: Secondary | ICD-10-CM | POA: Diagnosis not present

## 2023-07-10 DIAGNOSIS — Z3A37 37 weeks gestation of pregnancy: Secondary | ICD-10-CM

## 2023-07-10 LAB — POCT URINALYSIS DIPSTICK OB
Blood, UA: NEGATIVE
Leukocytes, UA: NEGATIVE
Nitrite, UA: NEGATIVE

## 2023-07-10 NOTE — Patient Instructions (Signed)
Alyssa Morgan  07/10/2023   Your procedure is scheduled on:  07/21/2023  Arrive at 1015 at [employer] [facility] on [address] at [facility]  and [address]. You are invited to use the FREE valet parking or use the Visitor's parking deck.  Pick up the phone at the desk and dial [phone].  Call this number if you have problems the morning of surgery: [phone]  Remember:   Do not eat food:(After Midnight) Desps de medianoche.  Do not drink clear liquids: (After Midnight) Desps de medianoche.  Take these medicines the morning of surgery with A SIP OF WATER:  none   Do not wear jewelry, make-up or nail polish.  Do not wear lotions, powders, or perfumes. Do not wear deodorant.  Do not shave 48 hours prior to surgery.  Do not bring valuables to the hospital.  [facility] is not   responsible for any belongings or valuables brought to the hospital.  Contacts, dentures or bridgework may not be worn into surgery.  Leave suitcase in the car. After surgery it may be brought to your room.  For patients admitted to the hospital, checkout time is 11:00 AM the day of              discharge.      Please read over the following fact sheets that you were given:     Preparing for Surgery
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
## 2023-07-10 NOTE — Telephone Encounter (Signed)
Preadmission screen  

## 2023-07-11 ENCOUNTER — Ambulatory Visit (INDEPENDENT_AMBULATORY_CARE_PROVIDER_SITE_OTHER): Payer: Medicaid Other | Admitting: *Deleted

## 2023-07-11 ENCOUNTER — Encounter (HOSPITAL_COMMUNITY): Payer: Self-pay

## 2023-07-11 VITALS — BP 123/79 | HR 85

## 2023-07-11 DIAGNOSIS — O0993 Supervision of high risk pregnancy, unspecified, third trimester: Secondary | ICD-10-CM

## 2023-07-11 DIAGNOSIS — O09299 Supervision of pregnancy with other poor reproductive or obstetric history, unspecified trimester: Secondary | ICD-10-CM

## 2023-07-11 DIAGNOSIS — O0990 Supervision of high risk pregnancy, unspecified, unspecified trimester: Secondary | ICD-10-CM

## 2023-07-11 DIAGNOSIS — O24415 Gestational diabetes mellitus in pregnancy, controlled by oral hypoglycemic drugs: Secondary | ICD-10-CM

## 2023-07-11 DIAGNOSIS — O0933 Supervision of pregnancy with insufficient antenatal care, third trimester: Secondary | ICD-10-CM

## 2023-07-11 DIAGNOSIS — Z3A37 37 weeks gestation of pregnancy: Secondary | ICD-10-CM

## 2023-07-11 NOTE — Progress Notes (Signed)
   NURSE VISIT- BLOOD PRESSURE CHECK  SUBJECTIVE:  Alyssa Morgan is a 27 y.o. G64P1011 female here for BP check. She is [redacted]w[redacted]d pregnant    HYPERTENSION ROS:  Pregnant:  Severe headaches that don't go away with tylenol/other medicines: No  Visual changes (seeing spots/double/blurred vision) No  Severe pain under right breast breast or in center of upper chest No  Severe nausea/vomiting No  Taking medicines as instructed not applicable   OBJECTIVE:  BP 123/79   Pulse 85   LMP  (LMP Unknown) Comment: Pt has irregular periods.  Appearance alert, well appearing, and in no distress.  ASSESSMENT: Pregnancy [redacted]w[redacted]d  blood pressure check  PLAN: Discussed with Dr. Charlotta Newton   Recommendations: no changes needed   Follow-up: as scheduled   Jobe Marker  07/11/2023 10:07 AM

## 2023-07-14 ENCOUNTER — Ambulatory Visit (INDEPENDENT_AMBULATORY_CARE_PROVIDER_SITE_OTHER): Payer: Medicaid Other | Admitting: Women's Health

## 2023-07-14 ENCOUNTER — Other Ambulatory Visit: Payer: Medicaid Other

## 2023-07-14 ENCOUNTER — Encounter: Payer: Self-pay | Admitting: Women's Health

## 2023-07-14 VITALS — BP 143/94 | HR 94 | Wt 285.0 lb

## 2023-07-14 DIAGNOSIS — O133 Gestational [pregnancy-induced] hypertension without significant proteinuria, third trimester: Secondary | ICD-10-CM | POA: Diagnosis not present

## 2023-07-14 DIAGNOSIS — Z1389 Encounter for screening for other disorder: Secondary | ICD-10-CM

## 2023-07-14 DIAGNOSIS — O0990 Supervision of high risk pregnancy, unspecified, unspecified trimester: Secondary | ICD-10-CM

## 2023-07-14 DIAGNOSIS — O0993 Supervision of high risk pregnancy, unspecified, third trimester: Secondary | ICD-10-CM | POA: Diagnosis not present

## 2023-07-14 DIAGNOSIS — Z3A38 38 weeks gestation of pregnancy: Secondary | ICD-10-CM | POA: Diagnosis not present

## 2023-07-14 DIAGNOSIS — O139 Gestational [pregnancy-induced] hypertension without significant proteinuria, unspecified trimester: Secondary | ICD-10-CM | POA: Insufficient documentation

## 2023-07-14 DIAGNOSIS — Z331 Pregnant state, incidental: Secondary | ICD-10-CM

## 2023-07-14 DIAGNOSIS — Z8759 Personal history of other complications of pregnancy, childbirth and the puerperium: Secondary | ICD-10-CM | POA: Insufficient documentation

## 2023-07-14 DIAGNOSIS — O24415 Gestational diabetes mellitus in pregnancy, controlled by oral hypoglycemic drugs: Secondary | ICD-10-CM | POA: Diagnosis not present

## 2023-07-14 LAB — POCT URINALYSIS DIPSTICK OB
Blood, UA: NEGATIVE
Glucose, UA: NEGATIVE
Ketones, UA: NEGATIVE
Leukocytes, UA: NEGATIVE
Nitrite, UA: NEGATIVE
POC,PROTEIN,UA: NEGATIVE

## 2023-07-14 NOTE — Patient Instructions (Signed)
[place], thank you for choosing our office today! We appreciate the opportunity to meet your healthcare needs. You may receive a short survey by mail, e-mail, or through [employer]. If you are happy with your care we would appreciate if you could take just a few minutes to complete the survey questions. We read all of your comments and take your feedback very seriously. Thank you again for choosing our office.  Center for [employer] Team at [facility]  [facility] at [facility] ([address] [place], [place] [address]) Entrance C, located off of [address] Free 24/7 valet parking   CLASSES: Go to [email or web address] to register for classes (childbirth, breastfeeding, waterbirth, infant CPR, daddy bootcamp, etc.)  Call the office [phone]) or go to [facility] if: You begin to have strong, frequent contractions Your water breaks.  Sometimes it is a big gush of fluid, sometimes it is just a trickle that keeps getting your panties wet or running down your legs You have vaginal bleeding.  It is normal to have a small amount of spotting if your cervix was checked.  You don't feel your baby moving like normal.  If you don't, get you something to eat and drink and lay down and focus on feeling your baby move.   If your baby is still not moving like normal, you should call the office or go to [facility].  Call the office [phone]) or go to [facility] hospital for these signs of pre-eclampsia: Severe headache that does not go away with Tylenol Visual changes- seeing spots, double, blurred vision Pain under your right breast or upper abdomen that does not go away with Tums or heartburn medicine Nausea and/or vomiting Severe swelling in your hands, feet, and face   [facility] Pediatricians/Family Doctors [facility]): [address] Colette Ribas, [phone]           [facility]: [address], [phone]                 [facility] Family Medicine [facility]): [address], [phone] (call to ask if accepting patients) [facility] Department: [address] [place], [phone]    [facility] Pediatricians/Family Doctors [facility]): [address], [phone] [facility]: [address] [place], [phone] [facility]: [address], [phone]  [facility]  [facility]): [phone] [facility]: [address], [phone]   [facility] Doctors [facility]: [address] [phone]  [facility] Doctors  [facility]: [phone], [phone]  Home Blood Pressure Monitoring for Patients   Your provider has recommended that you check your blood pressure (BP) at least once a week at home. If you do not have a blood pressure cuff at home, one will be provided for you. Contact your provider if you have not received your monitor within 1 week.   Helpful Tips for Accurate Home Blood Pressure Checks  Don't smoke, exercise, or drink caffeine 30 minutes before checking your BP Use the restroom before checking your BP (a full bladder can raise your pressure) Relax in a comfortable upright chair Feet on the ground Left arm resting comfortably on a flat surface at the level of your heart Legs uncrossed Back supported Sit quietly and don't talk Place the cuff on your bare arm Adjust snuggly, so that only two fingertips  can fit between your skin and the top of the cuff Check 2 readings separated by at least one minute Keep a log of your BP readings For a visual, please reference this diagram: http://ccnc.care/bpdiagram  Provider Name: [facility]     Phone: [phone]  Zone 1: ALL CLEAR  Continue to monitor your symptoms:  BP reading is less than 140 (top number) or less than 90 (bottom number)  No right  upper stomach pain No headaches or seeing spots No feeling nauseated or throwing up No swelling in face and hands  Zone 2: CAUTION Call your doctor's office for any of the following:  BP reading is greater than 140 (top number) or greater than 90 (bottom number)  Stomach pain under your ribs in the middle or right side Headaches or seeing spots Feeling nauseated or throwing up Swelling in face and hands  Zone 3: EMERGENCY  Seek immediate medical care if you have any of the following:  BP reading is greater than160 (top number) or greater than 110 (bottom number) Severe headaches not improving with Tylenol Serious difficulty catching your breath Any worsening symptoms from Zone 2   Braxton Hicks Contractions Contractions of the uterus can occur throughout pregnancy, but they are not always a sign that you are in labor. You may have practice contractions called Braxton Hicks contractions. These false labor contractions are sometimes confused with true labor. What are Deberah Pelton contractions? Braxton Hicks contractions are tightening movements that occur in the muscles of the uterus before labor. Unlike true labor contractions, these contractions do not result in opening (dilation) and thinning of the cervix. Toward the end of pregnancy (32-34 weeks), Braxton Hicks contractions can happen more often and may become stronger. These contractions are sometimes difficult to tell apart from true labor because they can be very uncomfortable. You should not feel embarrassed if you go to the hospital with false labor. Sometimes, the only way to tell if you are in true labor is for your health care provider to look for changes in the cervix. The health care provider will do a physical exam and may monitor your contractions. If you are not in true labor, the exam should show that your cervix is not dilating and your water has not broken. If there are no other health problems associated with your  pregnancy, it is completely safe for you to be sent home with false labor. You may continue to have Braxton Hicks contractions until you go into true labor. How to tell the difference between true labor and false labor True labor Contractions last 30-70 seconds. Contractions become very regular. Discomfort is usually felt in the top of the uterus, and it spreads to the lower abdomen and low back. Contractions do not go away with walking. Contractions usually become more intense and increase in frequency. The cervix dilates and gets thinner. False labor Contractions are usually shorter and not as strong as true labor contractions. Contractions are usually irregular. Contractions are often felt in the front of the lower abdomen and in the groin. Contractions may go away when you walk around or change positions while lying down. Contractions get weaker and are shorter-lasting as time goes on. The cervix usually does not dilate or become thin. Follow these instructions at home:  Take over-the-counter and prescription medicines only as told by your health care provider. Keep up with your usual exercises and follow other instructions from your health care provider. Eat and drink lightly if you think  you are going into labor. If Braxton Hicks contractions are making you uncomfortable: Change your position from lying down or resting to walking, or change from walking to resting. Sit and rest in a tub of warm water. Drink enough fluid to keep your urine pale yellow. Dehydration may cause these contractions. Do slow and deep breathing several times an hour. Keep all follow-up prenatal visits as told by your health care provider. This is important. Contact a health care provider if: You have a fever. You have continuous pain in your abdomen. Get help right away if: Your contractions become stronger, more regular, and closer together. You have fluid leaking or gushing from your vagina. You pass  blood-tinged mucus (bloody show). You have bleeding from your vagina. You have low back pain that you never had before. You feel your baby's head pushing down and causing pelvic pressure. Your baby is not moving inside you as much as it used to. Summary Contractions that occur before labor are called Braxton Hicks contractions, false labor, or practice contractions. Braxton Hicks contractions are usually shorter, weaker, farther apart, and less regular than true labor contractions. True labor contractions usually become progressively stronger and regular, and they become more frequent. Manage discomfort from [facility] contractions by changing position, resting in a warm bath, drinking plenty of water, or practicing deep breathing. This information is not intended to replace advice given to you by your health care provider. Make sure you discuss any questions you have with your health care provider. Document Revised: 10/31/2017 Document Reviewed: 04/03/2017 Elsevier Patient Education  2020 [employer].

## 2023-07-14 NOTE — Progress Notes (Signed)
HIGH-RISK PREGNANCY VISIT Patient name: Alyssa Morgan MRN [id number]  Date of birth: October 08, 1996 Chief Complaint:   Routine Prenatal Visit and Non-stress Test  History of Present Illness:   Alyssa Breisch is a 27 y.o. G68P1011 female at [redacted]w[redacted]d with an Estimated Date of Delivery: 07/26/23 being seen today for ongoing management of a high-risk pregnancy complicated by diabetes mellitus A2DM currently on metformin 1000mg  AM/500mg  PM , PG BMI 47, and gestational hypertension dx today  Today she reports  fastings 90-105 (6 of 8 >95), 2hr pp 98-136 (8 of 15 >120) . Denies severe ha, visual changes, ruq/epigastric pain, n/v.  Doesn't check home bp's. Contractions: Not present.  .  Movement: Present. denies leaking of fluid.      05/13/2023    3:28 PM  Depression screen PHQ 2/9  Decreased Interest 2  Down, Depressed, Hopeless 2  PHQ - 2 Score 4  Altered sleeping 3  Tired, decreased energy 3  Change in appetite 0  Feeling bad or failure about yourself  3  Trouble concentrating 0  Moving slowly or fidgety/restless 2  Suicidal thoughts 2  PHQ-9 Score 17        05/13/2023    3:28 PM  GAD 7 : Generalized Anxiety Score  Nervous, Anxious, on Edge 3  Control/stop worrying 3  Worry too much - different things 3  Trouble relaxing 3  Restless 3  Easily annoyed or irritable 3  Afraid - awful might happen 3  Total GAD 7 Score 21     Review of Systems:   Pertinent items are noted in HPI Denies abnormal vaginal discharge w/ itching/odor/irritation, headaches, visual changes, shortness of breath, chest pain, abdominal pain, severe nausea/vomiting, or problems with urination or bowel movements unless otherwise stated above. Pertinent History Reviewed:  Reviewed past medical,surgical, social, obstetrical and family history.  Reviewed problem list, medications and allergies. Physical Assessment:   Vitals:   07/14/23 1541 07/14/23 1608  BP: (!) 133/90 (!) 143/94  Pulse: 97 94  Weight: 285 lb  (129.3 kg)   Body mass index is 50.49 kg/m.           Physical Examination:   General appearance: alert, well appearing, and in no distress  Mental status: alert, oriented to person, place, and time  Skin: warm & dry   Extremities: Edema: None    Cardiovascular: normal heart rate noted  Respiratory: normal respiratory effort, no distress  Abdomen: gravid, soft, non-tender  Pelvic: Cervical exam deferred         Fetal Status:     Movement: Present    Fetal Surveillance Testing today: NST: FHR baseline 135 bpm, Variability: moderate, Accelerations:present, Decelerations:  Absent= Cat 1/reactive Toco: none    Chaperone: N/A    Results for orders placed or performed in visit on 07/14/23 (from the past 24 hour(s))  POC Urinalysis Dipstick OB   Collection Time: 07/14/23  4:03 PM  Result Value Ref Range   Color, UA     Clarity, UA     Glucose, UA Negative Negative   Bilirubin, UA     Ketones, UA negative    Spec Grav, UA     Blood, UA negative    pH, UA     POC,PROTEIN,UA Negative Negative, Trace, Small (1+), Moderate (2+), Large (3+), 4+   Urobilinogen, UA     Nitrite, UA negative    Leukocytes, UA Negative Negative   Appearance     Odor      Assessment &  Plan:  High-risk pregnancy: G3P1011 at [redacted]w[redacted]d with an Estimated Date of Delivery: 07/26/23   1) A2DM, suboptimal control, metformin 1000mg  AM/500mg  PM, EFW 41% & AFI 20cm at 36.3w  2) GHTN, dx today, ^bp on 8/8 as well, asymptomatic, no proteinuria, discussed w/ Dr. Charlotta Newton, moved her RCS up to tomorrow (was previously scheduled for next Mon 8/19). Reviewed pre-e s/s, reasons to seek care sooner  3) PG BMI 47  Meds: No orders of the defined types were placed in this encounter.  Labs/procedures today: NST  Treatment Plan:  RCS tomorrow  Reviewed: Term labor symptoms and general obstetric precautions including but not limited to vaginal bleeding, contractions, leaking of fluid and fetal movement were reviewed in detail with  the patient.  All questions were answered. Does have home bp cuff. Office bp cuff given: not applicable. Check bp daily, let us know if consistently >160 and/or >110.  Follow-up: Return for keep 8/15 appt for now, cancel rest.   Future Appointments  Date Time Provider Department Center  07/17/2023 11:10 AM [facility] NURSE [facility]  07/18/2023 10:30 AM [facility] PAT 1 [facility] None    Orders Placed This Encounter  Procedures   POC Urinalysis Dipstick OB   Cheral Marker CNM, [facility] 07/14/2023 5:03 PM

## 2023-07-15 ENCOUNTER — Other Ambulatory Visit: Payer: Self-pay

## 2023-07-15 ENCOUNTER — Encounter (HOSPITAL_COMMUNITY): Payer: Self-pay | Admitting: Obstetrics & Gynecology

## 2023-07-15 ENCOUNTER — Inpatient Hospital Stay (HOSPITAL_COMMUNITY): Payer: Medicaid Other | Admitting: Anesthesiology

## 2023-07-15 ENCOUNTER — Inpatient Hospital Stay (HOSPITAL_COMMUNITY)
Admission: AD | Admit: 2023-07-15 | Discharge: 2023-07-16 | DRG: 785 | Disposition: A | Discharge status: Home / Self Care | Payer: Medicaid Other | Attending: Obstetrics & Gynecology | Admitting: Obstetrics & Gynecology

## 2023-07-15 ENCOUNTER — Encounter (HOSPITAL_COMMUNITY)
Admission: AD | Disposition: A | Discharge status: Home / Self Care | Payer: Self-pay | Source: Home / Self Care | Attending: Obstetrics & Gynecology

## 2023-07-15 DIAGNOSIS — O99891 Other specified diseases and conditions complicating pregnancy: Secondary | ICD-10-CM | POA: Diagnosis present

## 2023-07-15 DIAGNOSIS — O24425 Gestational diabetes mellitus in childbirth, controlled by oral hypoglycemic drugs: Secondary | ICD-10-CM | POA: Diagnosis present

## 2023-07-15 DIAGNOSIS — Z87891 Personal history of nicotine dependence: Secondary | ICD-10-CM

## 2023-07-15 DIAGNOSIS — Z3A38 38 weeks gestation of pregnancy: Secondary | ICD-10-CM | POA: Diagnosis not present

## 2023-07-15 DIAGNOSIS — Z2839 Other underimmunization status: Secondary | ICD-10-CM

## 2023-07-15 DIAGNOSIS — O134 Gestational [pregnancy-induced] hypertension without significant proteinuria, complicating childbirth: Secondary | ICD-10-CM | POA: Diagnosis present

## 2023-07-15 DIAGNOSIS — Z8632 Personal history of gestational diabetes: Secondary | ICD-10-CM | POA: Diagnosis present

## 2023-07-15 DIAGNOSIS — O0990 Supervision of high risk pregnancy, unspecified, unspecified trimester: Secondary | ICD-10-CM

## 2023-07-15 DIAGNOSIS — O0933 Supervision of pregnancy with insufficient antenatal care, third trimester: Secondary | ICD-10-CM

## 2023-07-15 DIAGNOSIS — Z3490 Encounter for supervision of normal pregnancy, unspecified, unspecified trimester: Secondary | ICD-10-CM

## 2023-07-15 DIAGNOSIS — O99824 Streptococcus B carrier state complicating childbirth: Secondary | ICD-10-CM | POA: Diagnosis present

## 2023-07-15 DIAGNOSIS — Z8679 Personal history of other diseases of the circulatory system: Secondary | ICD-10-CM

## 2023-07-15 DIAGNOSIS — O24419 Gestational diabetes mellitus in pregnancy, unspecified control: Secondary | ICD-10-CM | POA: Diagnosis present

## 2023-07-15 DIAGNOSIS — Z98891 History of uterine scar from previous surgery: Secondary | ICD-10-CM

## 2023-07-15 DIAGNOSIS — Z8759 Personal history of other complications of pregnancy, childbirth and the puerperium: Secondary | ICD-10-CM | POA: Diagnosis present

## 2023-07-15 DIAGNOSIS — O99214 Obesity complicating childbirth: Secondary | ICD-10-CM | POA: Diagnosis present

## 2023-07-15 DIAGNOSIS — O34211 Maternal care for low transverse scar from previous cesarean delivery: Secondary | ICD-10-CM | POA: Diagnosis present

## 2023-07-15 DIAGNOSIS — O24424 Gestational diabetes mellitus in childbirth, insulin controlled: Secondary | ICD-10-CM | POA: Diagnosis not present

## 2023-07-15 DIAGNOSIS — O09299 Supervision of pregnancy with other poor reproductive or obstetric history, unspecified trimester: Secondary | ICD-10-CM

## 2023-07-15 DIAGNOSIS — Z302 Encounter for sterilization: Secondary | ICD-10-CM

## 2023-07-15 DIAGNOSIS — O24415 Gestational diabetes mellitus in pregnancy, controlled by oral hypoglycemic drugs: Secondary | ICD-10-CM

## 2023-07-15 DIAGNOSIS — O139 Gestational [pregnancy-induced] hypertension without significant proteinuria, unspecified trimester: Secondary | ICD-10-CM | POA: Diagnosis present

## 2023-07-15 DIAGNOSIS — O09899 Supervision of other high risk pregnancies, unspecified trimester: Secondary | ICD-10-CM

## 2023-07-15 DIAGNOSIS — O99210 Obesity complicating pregnancy, unspecified trimester: Secondary | ICD-10-CM | POA: Diagnosis present

## 2023-07-15 DIAGNOSIS — O9902 Anemia complicating childbirth: Secondary | ICD-10-CM | POA: Diagnosis present

## 2023-07-15 DIAGNOSIS — R8271 Bacteriuria: Secondary | ICD-10-CM | POA: Diagnosis present

## 2023-07-15 DIAGNOSIS — O99820 Streptococcus B carrier state complicating pregnancy: Secondary | ICD-10-CM | POA: Diagnosis not present

## 2023-07-15 HISTORY — PX: TUBAL LIGATION: SHX77

## 2023-07-15 LAB — CBC
HCT: 36 % (ref 36.0–46.0)
Hemoglobin: 11.7 g/dL — ABNORMAL LOW (ref 12.0–15.0)
MCH: 30.2 pg (ref 26.0–34.0)
MCHC: 32.5 g/dL (ref 30.0–36.0)
MCV: 93 fL (ref 80.0–100.0)
Platelets: 167 10*3/uL (ref 150–400)
RBC: 3.87 MIL/uL (ref 3.87–5.11)
RDW: 13.7 % (ref 11.5–15.5)
WBC: 6.5 10*3/uL (ref 4.0–10.5)
nRBC: 0 % (ref 0.0–0.2)

## 2023-07-15 LAB — TYPE AND SCREEN
ABO/RH(D): O POS
Antibody Screen: NEGATIVE

## 2023-07-15 LAB — GLUCOSE, CAPILLARY
Glucose-Capillary: 103 mg/dL — ABNORMAL HIGH (ref 70–99)
Glucose-Capillary: 93 mg/dL (ref 70–99)

## 2023-07-15 LAB — RPR: RPR Ser Ql: NONREACTIVE

## 2023-07-15 SURGERY — Surgical Case
Anesthesia: Spinal | Laterality: Bilateral

## 2023-07-15 MED ORDER — PHENYLEPHRINE HCL-NACL 20-0.9 MG/250ML-% IV SOLN
INTRAVENOUS | Status: DC | PRN
  Administered 2023-07-15: 60 ug/min via INTRAVENOUS

## 2023-07-15 MED ORDER — PHENYLEPHRINE HCL-NACL 20-0.9 MG/250ML-% IV SOLN
INTRAVENOUS | Status: AC
  Filled 2023-07-15: qty 250

## 2023-07-15 MED ORDER — KETOROLAC TROMETHAMINE 30 MG/ML IJ SOLN: 30.0000 mg | Freq: Four times a day (QID) | INTRAMUSCULAR | Status: DC | PRN

## 2023-07-15 MED ORDER — OXYTOCIN-SODIUM CHLORIDE 30-0.9 UT/500ML-% IV SOLN: 2.5000 [IU]/h | INTRAVENOUS | Status: AC

## 2023-07-15 MED ORDER — CEFAZOLIN IN SODIUM CHLORIDE 3-0.9 GM/100ML-% IV SOLN
3.0000 g | INTRAVENOUS | Status: AC
  Administered 2023-07-15: 3 g via INTRAVENOUS

## 2023-07-15 MED ORDER — ACETAMINOPHEN 10 MG/ML IV SOLN
INTRAVENOUS | Status: DC | PRN
  Administered 2023-07-15: 1000 mg via INTRAVENOUS

## 2023-07-15 MED ORDER — SENNOSIDES-DOCUSATE SODIUM 8.6-50 MG PO TABS
2.0000 | ORAL_TABLET | Freq: Every day | ORAL | Status: DC
  Administered 2023-07-16: 2 via ORAL
  Filled 2023-07-15: qty 2

## 2023-07-15 MED ORDER — SOD CITRATE-CITRIC ACID 500-334 MG/5ML PO SOLN
30.0000 mL | Freq: Once | ORAL | Status: AC
  Administered 2023-07-15: 30 mL via ORAL

## 2023-07-15 MED ORDER — DIPHENHYDRAMINE HCL 25 MG PO CAPS: 25.0000 mg | ORAL_CAPSULE | ORAL | Status: DC | PRN

## 2023-07-15 MED ORDER — ACETAMINOPHEN 10 MG/ML IV SOLN
INTRAVENOUS | Status: AC
  Filled 2023-07-15: qty 100

## 2023-07-15 MED ORDER — ACETAMINOPHEN 500 MG PO TABS
1000.0000 mg | ORAL_TABLET | Freq: Four times a day (QID) | ORAL | Status: DC
  Administered 2023-07-15 – 2023-07-16 (×3): 1000 mg via ORAL
  Filled 2023-07-15 (×3): qty 2

## 2023-07-15 MED ORDER — DIBUCAINE (PERIANAL) 1 % EX OINT: 1.0000 | TOPICAL_OINTMENT | CUTANEOUS | Status: DC | PRN

## 2023-07-15 MED ORDER — MEASLES, MUMPS & RUBELLA VAC IJ SOLR: 0.5000 mL | Freq: Once | INTRAMUSCULAR | Status: DC

## 2023-07-15 MED ORDER — SODIUM CHLORIDE 0.9% FLUSH: 3.0000 mL | INTRAVENOUS | Status: DC | PRN

## 2023-07-15 MED ORDER — ZOLPIDEM TARTRATE 5 MG PO TABS: 5.0000 mg | ORAL_TABLET | Freq: Every evening | ORAL | Status: DC | PRN

## 2023-07-15 MED ORDER — SOD CITRATE-CITRIC ACID 500-334 MG/5ML PO SOLN
ORAL | Status: AC
  Filled 2023-07-15: qty 30

## 2023-07-15 MED ORDER — PHENYLEPHRINE 80 MCG/ML (10ML) SYRINGE FOR IV PUSH (FOR BLOOD PRESSURE SUPPORT)
PREFILLED_SYRINGE | INTRAVENOUS | Status: AC
  Filled 2023-07-15: qty 10

## 2023-07-15 MED ORDER — SCOPOLAMINE 1 MG/3DAYS TD PT72
MEDICATED_PATCH | TRANSDERMAL | Status: AC
  Filled 2023-07-15: qty 1

## 2023-07-15 MED ORDER — FENTANYL CITRATE (PF) 100 MCG/2ML IJ SOLN: 25.0000 ug | INTRAMUSCULAR | Status: DC | PRN

## 2023-07-15 MED ORDER — SCOPOLAMINE 1 MG/3DAYS TD PT72
1.0000 | MEDICATED_PATCH | TRANSDERMAL | Status: DC
  Administered 2023-07-15: 1.5 mg via TRANSDERMAL

## 2023-07-15 MED ORDER — OXYTOCIN-SODIUM CHLORIDE 30-0.9 UT/500ML-% IV SOLN
INTRAVENOUS | Status: DC | PRN
  Administered 2023-07-15: 300 mL via INTRAVENOUS

## 2023-07-15 MED ORDER — ONDANSETRON HCL 4 MG/2ML IJ SOLN
INTRAMUSCULAR | Status: DC | PRN
  Administered 2023-07-15: 4 mg via INTRAVENOUS

## 2023-07-15 MED ORDER — NALOXONE HCL 4 MG/10ML IJ SOLN: 1.0000 ug/kg/h | INTRAVENOUS | Status: DC | PRN

## 2023-07-15 MED ORDER — TRANEXAMIC ACID-NACL 1000-0.7 MG/100ML-% IV SOLN
INTRAVENOUS | Status: AC
  Filled 2023-07-15: qty 100

## 2023-07-15 MED ORDER — DIPHENHYDRAMINE HCL 25 MG PO CAPS: 25.0000 mg | ORAL_CAPSULE | Freq: Four times a day (QID) | ORAL | Status: DC | PRN

## 2023-07-15 MED ORDER — BUPIVACAINE IN DEXTROSE 0.75-8.25 % IT SOLN
INTRATHECAL | Status: DC | PRN
  Administered 2023-07-15: 1.8 mL via INTRATHECAL

## 2023-07-15 MED ORDER — LACTATED RINGERS IV SOLN: INTRAVENOUS | Status: DC

## 2023-07-15 MED ORDER — METHYLERGONOVINE MALEATE 0.2 MG/ML IJ SOLN
INTRAMUSCULAR | Status: AC
  Filled 2023-07-15: qty 1

## 2023-07-15 MED ORDER — DEXAMETHASONE SODIUM PHOSPHATE 10 MG/ML IJ SOLN
INTRAMUSCULAR | Status: DC | PRN
  Administered 2023-07-15: 4 mg via INTRAVENOUS

## 2023-07-15 MED ORDER — HYDRALAZINE HCL 20 MG/ML IJ SOLN
5.0000 mg | Freq: Once | INTRAMUSCULAR | Status: AC
  Administered 2023-07-15: 5 mg via INTRAVENOUS
  Filled 2023-07-15: qty 1

## 2023-07-15 MED ORDER — FENTANYL CITRATE (PF) 100 MCG/2ML IJ SOLN
INTRAMUSCULAR | Status: DC | PRN
  Administered 2023-07-15: 15 ug via INTRATHECAL

## 2023-07-15 MED ORDER — SIMETHICONE 80 MG PO CHEW: 80.0000 mg | CHEWABLE_TABLET | ORAL | Status: DC | PRN

## 2023-07-15 MED ORDER — WITCH HAZEL-GLYCERIN EX PADS: 1.0000 | MEDICATED_PAD | CUTANEOUS | Status: DC | PRN

## 2023-07-15 MED ORDER — MENTHOL 3 MG MT LOZG: 1.0000 | LOZENGE | OROMUCOSAL | Status: DC | PRN

## 2023-07-15 MED ORDER — SIMETHICONE 80 MG PO CHEW
80.0000 mg | CHEWABLE_TABLET | Freq: Three times a day (TID) | ORAL | Status: DC
  Administered 2023-07-15 – 2023-07-16 (×2): 80 mg via ORAL
  Filled 2023-07-15 (×2): qty 1

## 2023-07-15 MED ORDER — ENOXAPARIN SODIUM 60 MG/0.6ML IJ SOSY: 60.0000 mg | PREFILLED_SYRINGE | INTRAMUSCULAR | Status: DC

## 2023-07-15 MED ORDER — MEPERIDINE HCL 25 MG/ML IJ SOLN: 6.2500 mg | INTRAMUSCULAR | Status: DC | PRN

## 2023-07-15 MED ORDER — IBUPROFEN 600 MG PO TABS: 600.0000 mg | ORAL_TABLET | Freq: Four times a day (QID) | ORAL | Status: DC

## 2023-07-15 MED ORDER — FUROSEMIDE 20 MG PO TABS
20.0000 mg | ORAL_TABLET | Freq: Two times a day (BID) | ORAL | Status: DC
  Administered 2023-07-15 – 2023-07-16 (×2): 20 mg via ORAL
  Filled 2023-07-15 (×2): qty 1

## 2023-07-15 MED ORDER — NALOXONE HCL 0.4 MG/ML IJ SOLN: 0.4000 mg | INTRAMUSCULAR | Status: DC | PRN

## 2023-07-15 MED ORDER — STERILE WATER FOR IRRIGATION IR SOLN
Status: DC | PRN
  Administered 2023-07-15: 1

## 2023-07-15 MED ORDER — METOCLOPRAMIDE HCL 5 MG/ML IJ SOLN
INTRAMUSCULAR | Status: AC
  Filled 2023-07-15: qty 2

## 2023-07-15 MED ORDER — PRENATAL MULTIVITAMIN CH: 1.0000 | ORAL_TABLET | Freq: Every day | ORAL | Status: DC

## 2023-07-15 MED ORDER — TRANEXAMIC ACID-NACL 1000-0.7 MG/100ML-% IV SOLN
1000.0000 mg | Freq: Once | INTRAVENOUS | Status: AC
  Administered 2023-07-15: 1000 mg via INTRAVENOUS

## 2023-07-15 MED ORDER — METHYLERGONOVINE MALEATE 0.2 MG/ML IJ SOLN
0.2000 mg | Freq: Once | INTRAMUSCULAR | Status: AC
  Administered 2023-07-15: 0.2 mg via INTRAMUSCULAR

## 2023-07-15 MED ORDER — MORPHINE SULFATE (PF) 0.5 MG/ML IJ SOLN
INTRAMUSCULAR | Status: DC | PRN
  Administered 2023-07-15: .15 mg via INTRATHECAL

## 2023-07-15 MED ORDER — OXYTOCIN-SODIUM CHLORIDE 30-0.9 UT/500ML-% IV SOLN
INTRAVENOUS | Status: AC
  Filled 2023-07-15: qty 500

## 2023-07-15 MED ORDER — HYDRALAZINE HCL 20 MG/ML IJ SOLN
10.0000 mg | Freq: Once | INTRAMUSCULAR | Status: AC
  Administered 2023-07-15: 10 mg via INTRAVENOUS

## 2023-07-15 MED ORDER — MORPHINE SULFATE (PF) 0.5 MG/ML IJ SOLN
INTRAMUSCULAR | Status: AC
  Filled 2023-07-15: qty 10

## 2023-07-15 MED ORDER — OXYCODONE HCL 5 MG PO TABS: 5.0000 mg | ORAL_TABLET | ORAL | Status: DC | PRN

## 2023-07-15 MED ORDER — TRANEXAMIC ACID-NACL 1000-0.7 MG/100ML-% IV SOLN
1000.0000 mg | INTRAVENOUS | Status: AC
  Administered 2023-07-15: 1000 mg via INTRAVENOUS

## 2023-07-15 MED ORDER — CEFAZOLIN IN SODIUM CHLORIDE 3-0.9 GM/100ML-% IV SOLN
INTRAVENOUS | Status: AC
  Filled 2023-07-15: qty 100

## 2023-07-15 MED ORDER — FENTANYL CITRATE (PF) 100 MCG/2ML IJ SOLN
INTRAMUSCULAR | Status: AC
  Filled 2023-07-15: qty 2

## 2023-07-15 MED ORDER — DIPHENHYDRAMINE HCL 50 MG/ML IJ SOLN: 12.5000 mg | INTRAMUSCULAR | Status: DC | PRN

## 2023-07-15 MED ORDER — ONDANSETRON HCL 4 MG/2ML IJ SOLN: 4.0000 mg | Freq: Three times a day (TID) | INTRAMUSCULAR | Status: DC | PRN

## 2023-07-15 MED ORDER — COCONUT OIL OIL: 1.0000 | TOPICAL_OIL | Status: DC | PRN

## 2023-07-15 MED ORDER — ONDANSETRON HCL 4 MG/2ML IJ SOLN
INTRAMUSCULAR | Status: AC
  Filled 2023-07-15: qty 2

## 2023-07-15 MED ORDER — POVIDONE-IODINE 10 % EX SWAB
2.0000 | Freq: Once | CUTANEOUS | Status: AC
  Administered 2023-07-15: 2 via TOPICAL

## 2023-07-15 MED ORDER — DEXAMETHASONE SODIUM PHOSPHATE 4 MG/ML IJ SOLN
INTRAMUSCULAR | Status: AC
  Filled 2023-07-15: qty 2

## 2023-07-15 MED ORDER — KETOROLAC TROMETHAMINE 30 MG/ML IJ SOLN
30.0000 mg | Freq: Four times a day (QID) | INTRAMUSCULAR | Status: AC
  Administered 2023-07-15 – 2023-07-16 (×4): 30 mg via INTRAVENOUS
  Filled 2023-07-15 (×4): qty 1

## 2023-07-15 MED ORDER — NIFEDIPINE ER OSMOTIC RELEASE 30 MG PO TB24
30.0000 mg | ORAL_TABLET | Freq: Every day | ORAL | Status: DC
  Administered 2023-07-15 – 2023-07-16 (×2): 30 mg via ORAL
  Filled 2023-07-15 (×2): qty 1

## 2023-07-15 SURGICAL SUPPLY — 36 items
ADH SKN CLS APL DERMABOND .7 (GAUZE/BANDAGES/DRESSINGS) ×4
CATH ROBINSON RED A/P 16FR (CATHETERS) IMPLANT
CHLORAPREP W/TINT 26ML (MISCELLANEOUS) ×4 IMPLANT
CLAMP UMBILICAL CORD (MISCELLANEOUS) ×2 IMPLANT
CLOTH BEACON ORANGE TIMEOUT ST (SAFETY) ×2 IMPLANT
DERMABOND ADVANCED .7 DNX12 (GAUZE/BANDAGES/DRESSINGS) ×4 IMPLANT
DRSG OPSITE POSTOP 4X10 (GAUZE/BANDAGES/DRESSINGS) ×2 IMPLANT
ELECT REM PT RETURN 9FT ADLT (ELECTROSURGICAL) ×2
ELECTRODE REM PT RTRN 9FT ADLT (ELECTROSURGICAL) ×2 IMPLANT
EXTRACTOR VACUUM M CUP 4 TUBE (SUCTIONS) IMPLANT
GLOVE BIOGEL PI IND STRL 7.0 (GLOVE) ×2 IMPLANT
GLOVE BIOGEL PI IND STRL 7.5 (GLOVE) ×2 IMPLANT
GLOVE ECLIPSE 7.5 STRL STRAW (GLOVE) ×2 IMPLANT
GOWN STRL REUS W/TWL LRG LVL3 (GOWN DISPOSABLE) ×6 IMPLANT
KIT ABG SYR 3ML LUER SLIP (SYRINGE) IMPLANT
LIGASURE IMPACT 36 18CM CVD LR (INSTRUMENTS) IMPLANT
MAT PREVALON FULL STRYKER (MISCELLANEOUS) IMPLANT
NDL HYPO 25X5/8 SAFETYGLIDE (NEEDLE) IMPLANT
NEEDLE HYPO 25X5/8 SAFETYGLIDE (NEEDLE) IMPLANT
NS IRRIG 1000ML POUR BTL (IV SOLUTION) ×2 IMPLANT
PACK C SECTION WH (CUSTOM PROCEDURE TRAY) ×2 IMPLANT
PAD OB MATERNITY 4.3X12.25 (PERSONAL CARE ITEMS) ×2 IMPLANT
PENCIL SMOKE EVAC W/HOLSTER (ELECTROSURGICAL) ×2 IMPLANT
RETRACTOR TRAXI PANNICULUS (MISCELLANEOUS) IMPLANT
RTRCTR C-SECT PINK 25CM LRG (MISCELLANEOUS) ×2 IMPLANT
SUT MNCRL 0 VIOLET CTX 36 (SUTURE) ×4 IMPLANT
SUT PLAIN 2 0 (SUTURE) ×2
SUT PLAIN ABS 2-0 54XMFL TIE (SUTURE) ×2 IMPLANT
SUT VIC AB 0 CTX 36 (SUTURE) ×4
SUT VIC AB 0 CTX36XBRD ANBCTRL (SUTURE) ×2 IMPLANT
SUT VIC AB 2-0 CT1 27 (SUTURE) ×2
SUT VIC AB 2-0 CT1 TAPERPNT 27 (SUTURE) ×2 IMPLANT
SUT VIC AB 4-0 KS 27 (SUTURE) ×2 IMPLANT
TOWEL OR 17X24 6PK STRL BLUE (TOWEL DISPOSABLE) ×2 IMPLANT
TRAY FOLEY W/BAG SLVR 14FR LF (SET/KITS/TRAYS/PACK) ×2 IMPLANT
WATER STERILE IRR 1000ML POUR (IV SOLUTION) ×2 IMPLANT

## 2023-07-15 NOTE — Anesthesia Procedure Notes (Signed)
Spinal  Patient location during procedure: OR Start time: 07/15/2023 10:51 AM End time: 07/15/2023 10:56 AM Reason for block: surgical anesthesia Staffing Performed: anesthesiologist  Anesthesiologist: Mal Amabile, MD Performed by: Mal Amabile, MD Authorized by: Mal Amabile, MD   Preanesthetic Checklist Completed: patient identified, IV checked, site marked, risks and benefits discussed, surgical consent, monitors and equipment checked, pre-op evaluation and timeout performed Spinal Block Patient position: sitting Prep: DuraPrep and site prepped and draped Patient monitoring: heart rate, cardiac monitor, continuous pulse ox and blood pressure Approach: midline Location: L3-4 Injection technique: single-shot Needle Needle type: Pencan  Needle gauge: 24 G Needle length: 9 cm Needle insertion depth: 8 cm Assessment Sensory level: T4 Events: CSF return Additional Notes Patient tolerated procedure well. Adequate sensory level. Attempt x 2, technically difficult due to super MO

## 2023-07-15 NOTE — Anesthesia Postprocedure Evaluation (Signed)
Anesthesia Post Note  Patient: Alyssa Morgan  Procedure(s) Performed: CESAREAN SECTION WITH BILATERAL TUBAL LIGATION (Bilateral) BILATERAL TUBAL LIGATION     Patient location during evaluation: PACU Anesthesia Type: Spinal Level of consciousness: oriented and awake and alert Pain management: pain level controlled Vital Signs Assessment: post-procedure vital signs reviewed and stable Respiratory status: spontaneous breathing, respiratory function stable and nonlabored ventilation Cardiovascular status: blood pressure returned to baseline and stable Postop Assessment: no headache, no backache, no apparent nausea or vomiting, spinal receding and patient able to bend at knees Anesthetic complications: no   No notable events documented.  Last Vitals:  Vitals:   07/15/23 1330 07/15/23 1400  BP: (!) 144/79 (!) 157/89  Pulse: 64 67  Resp: 11 12  Temp:  36.9 C  SpO2: 98% 98%    Last Pain:  Vitals:   07/15/23 1410  TempSrc:   PainSc: 1    Pain Goal: Patients Stated Pain Goal: 4 (07/15/23 1235)  LLE Motor Response: Non-purposeful movement (07/15/23 1330) LLE Sensation: Tingling (07/15/23 1330) RLE Motor Response: Non-purposeful movement (07/15/23 1330) RLE Sensation: Tingling (07/15/23 1330) L Sensory Level: L3-Anterior knee, lower leg (07/15/23 1330) R Sensory Level: L3-Anterior knee, lower leg (07/15/23 1330) Epidural/Spinal Function Cutaneous sensation: Able to Wiggle Toes (07/15/23 1410), Patient able to flex knees: Yes (07/15/23 1410), Patient able to lift hips off bed: Yes (07/15/23 1410), Back pain beyond tenderness at insertion site: No (07/15/23 1410), Progressively worsening motor and/or sensory loss: No (07/15/23 1410), Bowel and/or bladder incontinence post epidural: No (07/15/23 1410)  [name]

## 2023-07-15 NOTE — Op Note (Signed)
Alyssa Morgan PROCEDURE DATE: 07/15/2023  PREOPERATIVE DIAGNOSIS: Intrauterine pregnancy at [redacted]w[redacted]d weeks gestation; patient declines vag del attempt. Hx of prior C/S.  POSTOPERATIVE DIAGNOSIS: The same  PROCEDURE:  Low Transverse Cesarean Section with bilateral salpingectomy  SURGEON:  Dr. Candelaria Celeste  ASSISTANT: Dr. Earlene Plater    INDICATIONS: Alyssa Morgan is a 27 y.o. [id number] at [redacted]w[redacted]d scheduled for cesarean section secondary to patient declines vag del attempt.  The risks of cesarean section discussed with the patient included but were not limited to: bleeding which may require transfusion or reoperation; infection which may require antibiotics; injury to bowel, bladder, ureters or other surrounding organs; injury to the fetus; need for additional procedures including hysterectomy in the event of a life-threatening hemorrhage; placental abnormalities wth subsequent pregnancies, incisional problems, thromboembolic phenomenon and other postoperative/anesthesia complications. The patient concurred with the proposed plan, giving informed written consent for the procedure.    FINDINGS:  Viable female infant in cephalic presentation.  Apgars 8 and 9, weight 3480 g.  Clear amniotic fluid.  Intact placenta, three vessel cord.  Normal uterus, fallopian tubes and ovaries bilaterally.  ANESTHESIA:    Spinal INTRAVENOUS FLUIDS:2600 ml ESTIMATED BLOOD LOSS: 462 ml URINE OUTPUT:  175 ml SPECIMENS: Placenta sent to L&D COMPLICATIONS: None immediate  PROCEDURE IN DETAIL:  The patient received intravenous antibiotics and had sequential compression devices applied to her lower extremities while in the preoperative area.  She was then taken to the operating room where spinal anesthesia was administered (epidural anesthesia was dosed up to surgical level) and was found to be adequate. She was then placed in a dorsal supine position with a leftward tilt, and prepped and draped in a sterile manner.  A foley catheter  was placed into her bladder and attached to constant gravity, which drained clear fluid throughout.  After an adequate timeout was performed, a Pfannenstiel skin incision was made with scalpel and carried through to the underlying layer of fascia. The fascia was incised in the midline and this incision was extended bilaterally using the Mayo scissors. Kocher clamps were applied to the superior aspect of the fascial incision and the underlying rectus muscles were dissected off bluntly. A similar process was carried out on the inferior aspect of the facial incision. The rectus muscles were separated in the midline bluntly and the peritoneum was entered bluntly. An Alexis retractor was placed to aid in visualization of the uterus.  Attention was turned to the lower uterine segment where a transverse hysterotomy was made with a scalpel and extended bilaterally bluntly. The infant was successfully delivered, and cord was clamped and cut after 1 minute delay and infant was handed over to awaiting neonatology team. Uterine massage was then administered and the placenta delivered intact with three-vessel cord. The uterus was then cleared of clot and debris.  The hysterotomy was closed with 0 Vicryl in a running fashion. Attention was then turned to the patient's adnexa. The left fallopian tube was identified and followed out to the fimbriated end.  Ligasure device was used to cauterize and cut the mesosalpinx to proximal end of the fallopian tube, removing 6cm of tube. A similar process was carried out on the right side allowing for bilateral tubal sterilization.   Overall, excellent hemostasis was noted. The abdomen and the pelvis were cleared of all clot and debris and the Jon Gills was removed. Hemostasis was confirmed on all surfaces.  The peritoneum was reapproximated using 2-0 vicryl running stitches. The fascia was then closed using 0 Vicryl  in a running fashion. The subcutaneous layer was reapproximated with plain gut  and the skin was closed with 4-0 vicryl. The patient tolerated the procedure well. Sponge, lap, instrument and needle counts were correct x 2. She was taken to the recovery room in stable condition.    Joanne Gavel, MD 07/15/2023 12:25 PM OB Fellow

## 2023-07-15 NOTE — Discharge Summary (Signed)
Postpartum Discharge Summary  Date of Service updated***     Patient Name: Alyssa Morgan DOB: Oct 25, 1996 MRN: [id number]  Date of admission: 07/15/2023 Delivery date: 07/15/2023 Delivering provider: Levie Heritage Date of discharge: 07/15/2023  Admitting diagnosis: Intrauterine normal pregnancy [Z34.90] Intrauterine pregnancy: [redacted]w[redacted]d     Secondary diagnosis:  Principal Problem:   Status post repeat low transverse cesarean section Active Problems:   Obesity affecting pregnancy   History of gestational diabetes in prior pregnancy, currently pregnant   Late prenatal care in third trimester   Supervision of high risk pregnancy, antepartum   Asymptomatic bacteriuria during pregnancy in third trimester   Gestational diabetes   History of postpartum hypertension   Rubella non-immune status, antepartum   Gestational hypertension   Intrauterine normal pregnancy  Additional problems: ***    Discharge diagnosis: Term Pregnancy Delivered                                              Post partum procedures:postpartum tubal ligation Augmentation: N/A Complications: None  Hospital course: Sceduled C/S   27 y.o. yo [id number] at [redacted]w[redacted]d was admitted to the hospital 07/15/2023 for scheduled cesarean section with the following indication:Elective Repeat. Delivery details are as follows:  Membrane Rupture Time/Date: 11:27 AM,07/15/2023  Delivery Method:C-Section, Low Transverse Operative Delivery:N/A Details of operation can be found in separate operative note.  Patient had a postpartum course complicated by***.  She is ambulating, tolerating a regular diet, passing flatus, and urinating well. Patient is discharged home in stable condition on  07/15/23        Newborn Data: Birth date:07/15/2023 Birth time:11:28 AM Gender:Female Living status:Living Apgars:8 ,9  Weight:3480 g    Magnesium Sulfate received: No BMZ received: No Rhophylac:No MMR:Yes T-DaP:{Tdap:23962} Flu:  No Transfusion:{Transfusion received:30440034}  Physical exam  Vitals:   07/15/23 1315 07/15/23 1319 07/15/23 1330 07/15/23 1400  BP: 135/85  (!) 144/79 (!) 157/89  Pulse: 65 66 64 67  Resp: 13 17 11 12   Temp:    98.5 F (36.9 C)  TempSrc:    Oral  SpO2: 97% 95% 98% 98%  Weight:      Height:       General: {Exam; general:21111117} Lochia: {Desc; appropriate/inappropriate:30686::"appropriate"} Uterine Fundus: {Desc; firm/soft:30687} Incision: {Exam; incision:21111123} DVT Evaluation: {Exam; dvt:2111122} Labs: Lab Results  Component Value Date   WBC 6.5 07/15/2023   HGB 11.7 (L) 07/15/2023   HCT 36.0 07/15/2023   MCV 93.0 07/15/2023   PLT 167 07/15/2023      Latest Ref Rng & Units 05/20/2023    9:13 AM  CMP  Glucose 70 - 99 mg/dL 147   BUN 6 - 20 mg/dL 4   Creatinine 8.29 - 5.62 mg/dL 1.30   Sodium 865 - 784 mmol/L 136   Potassium 3.5 - 5.2 mmol/L 4.0   Chloride 96 - 106 mmol/L 104   CO2 20 - 29 mmol/L 18   Calcium 8.7 - 10.2 mg/dL 9.3   Total Protein 6.0 - 8.5 g/dL 6.1   Total Bilirubin 0.0 - 1.2 mg/dL 0.3   Alkaline Phos 44 - 121 IU/L 80   AST 0 - 40 IU/L 15   ALT 0 - 32 IU/L 12    Edinburgh Score:     No data to display           After visit meds:  Allergies as of  07/15/2023       Reactions   Mushroom Extract Complex Rash     Med Rec must be completed prior to using this [facility]***        Discharge home in stable condition Infant Feeding: Breast Infant Disposition:home with mother Discharge instruction: per After Visit Summary and Postpartum booklet. Activity: Advance as tolerated. Pelvic rest for 6 weeks.  Diet: routine diet Future Appointments: Future Appointments  Date Time Provider Department Center  07/18/2023 10:30 AM [facility] PAT 1 [facility] None   Follow up Visit:  [facility]  Please schedule this patient for a In person postpartum visit in 6 weeks with the following provider: Any provider. Additional Postpartum  F/U:Incision check 1 week  High risk pregnancy complicated by: GDM and HTN Delivery mode:  C-Section, Low Transverse Anticipated Birth Control:  BTL done [facility]   07/15/2023 Joanne Gavel, MD

## 2023-07-15 NOTE — Transfer of Care (Signed)
Immediate Anesthesia Transfer of Care Note  Patient: Alyssa Morgan  Procedure(s) Performed: CESAREAN SECTION WITH BILATERAL TUBAL LIGATION (Bilateral)  Patient Location: PACU  Anesthesia Type:Spinal  Level of Consciousness: awake  Airway & Oxygen Therapy: Patient Spontanous Breathing  Post-op Assessment: Report given to RN and Post -op Vital signs reviewed and stable  Post vital signs: Reviewed and stable  Last Vitals:  Vitals Value Taken Time  BP 142/82 07/15/23 1235  Temp 36.8 C 07/15/23 1235  Pulse 64 07/15/23 1235  Resp 12 07/15/23 1235  SpO2 98 % 07/15/23 1235  Vitals shown include unfiled device data.  Last Pain:  Vitals:   07/15/23 1235  TempSrc: Oral         Complications: No notable events documented.

## 2023-07-15 NOTE — Lactation Note (Signed)
This note was copied from a baby's chart. Lactation Consultation Note Mom is formula feeding.  Patient Name: Alyssa Morgan Today's Date: 07/15/2023 Age:28 hours     Maternal Data    Feeding Nipple Type: Slow - flow  LATCH Score                    Lactation Tools Discussed/Used    Interventions    Discharge    Consult Status Consult Status: Complete    Charyl Dancer 07/15/2023, 7:49 PM

## 2023-07-15 NOTE — Progress Notes (Signed)
Have received calls from pt's RN regarding elevated blood pressures. Pt has hx gHTN. Pt received methergine 0.2mg  after her c-section.  Discussed with team, decided to start pt on 30mg  procardia and give hydralazine to bring current pressures down. Received another call from RN regarding 158/92 BP, pt had recently thrown up (unclear if she threw up the procardia) Will proceed with 10mg  hydralazine. Will also add 20mg  lasix BID x5days. Continue procardia 30. CTM Bps.

## 2023-07-15 NOTE — Anesthesia Preprocedure Evaluation (Addendum)
Anesthesia Evaluation  Patient identified by MRN, date of birth, ID band Patient awake    Reviewed: Allergy & Precautions, NPO status , Patient's Chart, lab work & pertinent test results, reviewed documented beta blocker date and time   Airway Mallampati: III  TM Distance: >3 FB    Comment: Pierced tongue Dental no notable dental hx. (+) Teeth Intact, Dental Advisory Given   Pulmonary former smoker   Pulmonary exam normal breath sounds clear to auscultation       Cardiovascular hypertension, Normal cardiovascular exam Rhythm:Regular Rate:Normal     Neuro/Psych   Anxiety Depression    negative neurological ROS     GI/Hepatic   Endo/Other  diabetes, Well Controlled, Oral Hypoglycemic Agents  Morbid obesity  Renal/GU   negative genitourinary   Musculoskeletal negative musculoskeletal ROS (+)    Abdominal  (+) + obese  Peds  Hematology   Anesthesia Other Findings   Reproductive/Obstetrics (+) Pregnancy Hx/o previous C/Section  Undesired fertility                             Anesthesia Physical Anesthesia Plan  ASA: 3  Anesthesia Plan: Spinal   Post-op Pain Management: Minimal or no pain anticipated   Induction: Intravenous  PONV Risk Score and Plan: 4 or greater and Treatment may vary due to age or medical condition and Scopolamine patch - Pre-op  Airway Management Planned: Natural Airway  Additional Equipment: None and Fetal Monitoring  Intra-op Plan:   Post-operative Plan:   Informed Consent: I have reviewed the patients History and Physical, chart, labs and discussed the procedure including the risks, benefits and alternatives for the proposed anesthesia with the patient or authorized representative who has indicated his/her understanding and acceptance.     Dental advisory given  Plan Discussed with: Anesthesiologist and CRNA  Anesthesia Plan Comments:          Anesthesia Quick Evaluation

## 2023-07-15 NOTE — H&P (Signed)
Faculty Practice H&P  Alyssa Morgan is a 27 y.o. female G3P1011 with IUP at [redacted]w[redacted]d presenting for repeat cesarean section for uncontrolled gestational diabetes. Her CBGs have been increasing over the past few weeks and she has developed gestational hypertension. Pregnancy was been complicated by GDM A2, GHTN, BMI 47.    Pt states she has been having no contractions, no vaginal bleeding, intact membranes, with normal fetal movement.     Prenatal Course Source of Care:  FT  with onset of care at 26weeks  Pregnancy complications or risks: Patient Active Problem List   Diagnosis Date Noted   Intrauterine normal pregnancy 07/15/2023   Gestational hypertension 07/14/2023   Rubella non-immune status, antepartum 05/26/2023   Gestational diabetes 05/21/2023   History of postpartum hypertension 05/21/2023   Asymptomatic bacteriuria during pregnancy in third trimester 05/19/2023   History of cesarean delivery 05/13/2023   History of gestational diabetes in prior pregnancy, currently pregnant 05/13/2023   Late prenatal care in third trimester 05/13/2023   Supervision of high risk pregnancy, antepartum 05/13/2023   Depression with anxiety 05/13/2023   Moderate alcohol use disorder (HCC) 09/24/2021   Major depressive disorder with current active episode 09/24/2021   Obesity affecting pregnancy in second trimester    She desires bilateral tubal ligation for contraception.  She plans to breastfeed, plans to bottle feed  Prenatal labs and studies: ABO, Rh: --/--/O POS (08/13 2595) Antibody: NEG (08/13 6387) Rubella: <0.90 (06/18 0830) RPR: Non Reactive (06/18 0830)  HBsAg: Negative (06/18 0830)  HIV: Non Reactive (06/18 0830)  GBS: Positive/-- (07/31 1400)  2hr Glucola: positive Genetic screening: normal Anatomy US: normal  Past Medical History:  Past Medical History:  Diagnosis Date   Depression    Gestational diabetes     Past Surgical History:  Past Surgical History:  Procedure  Laterality Date   CESAREAN SECTION     NO PAST SURGERIES      Obstetrical History:  OB History     Gravida  3   Para  1   Term  1   Preterm      AB  1   Living  1      SAB      IAB  1   Ectopic      Multiple      Live Births  1           Gynecological History:  OB History     Gravida  3   Para  1   Term  1   Preterm      AB  1   Living  1      SAB      IAB  1   Ectopic      Multiple      Live Births  1           Social History:  Social History   Socioeconomic History   Marital status: Single    Spouse name: Not on file   Number of children: Not on file   Years of education: Not on file   Highest education level: Not on file  Occupational History   Not on file  Tobacco Use   Smoking status: Former    Current packs/day: 0.50    Average packs/day: 0.5 packs/day for 2.0 years (1.0 ttl pk-yrs)    Types: Cigarettes   Smokeless tobacco: Never  Vaping Use   Vaping status: Former  Substance and Sexual Activity   Alcohol use:  No   Drug use: Not Currently    Types: Marijuana   Sexual activity: Yes    Birth control/protection: None  Other Topics Concern   Not on file  Social History Narrative   Not on file   Social Determinants of Health   Financial Resource Strain: Low Risk  (05/13/2023)   Overall Financial Resource Strain (CARDIA)    Difficulty of Paying Living Expenses: Not hard at all  Food Insecurity: No Food Insecurity (07/15/2023)   Hunger Vital Sign    Worried About Running Out of Food in the Last Year: Never true    Ran Out of Food in the Last Year: Never true  Transportation Needs: No Transportation Needs (07/15/2023)   PRAPARE - Administrator, Civil Service (Medical): No    Lack of Transportation (Non-Medical): No  Physical Activity: Insufficiently Active (05/13/2023)   Exercise Vital Sign    Days of Exercise per Week: 2 days    Minutes of Exercise per Session: 30 min  Stress: Stress Concern  Present (05/13/2023)   [employer] of Occupational Health - Occupational Stress Questionnaire    Feeling of Stress : Very much  Social Connections: Socially Isolated (05/13/2023)   Social Connection and Isolation Panel [NHANES]    Frequency of Communication with Friends and Family: Twice a week    Frequency of Social Gatherings with Friends and Family: Twice a week    Attends Religious Services: Never    Database administrator or Organizations: No    Attends Engineer, structural: Never    Marital Status: Never married    Family History:  Family History  Problem Relation Age of Onset   Diabetes Maternal Grandmother    Hypercholesterolemia Maternal Grandmother    Stroke Maternal Grandfather     Medications:  Prenatal vitamins,  Current Facility-Administered Medications  Medication Dose Route Frequency Provider Last Rate Last Admin   ceFAZolin (ANCEF) IVPB 3g/100 mL premix  3 g Intravenous On Call to OR Myna Hidalgo, DO       lactated ringers infusion   Intravenous Continuous Myna Hidalgo, DO 125 mL/hr at 07/15/23 0656 New Bag at 07/15/23 0656   scopolamine (TRANSDERM-SCOP) 1 MG/3DAYS 1.5 mg  1 patch Transdermal Q72H Mal Amabile, MD   1.5 mg at 07/15/23 0741   tranexamic acid (CYKLOKAPRON) IVPB 1,000 mg  1,000 mg Intravenous Once Myna Hidalgo, DO        Allergies:  Allergies  Allergen Reactions   Mushroom Extract Complex Rash    Review of Systems: -  negative  Physical Exam: Blood pressure (!) 149/92, pulse 83, temperature 99.5 F (37.5 C), resp. rate 16, height 5\' 3"  (1.6 m), weight 129 kg, SpO2 97%, unknown if currently breastfeeding. GENERAL: Well-developed, well-nourished female in no acute distress.  LUNGS: Clear to auscultation bilaterally.  HEART: Regular rate and rhythm. ABDOMEN: Soft, nontender, nondistended, gravid. EFW 41%  EXTREMITIES: Nontender, no edema, 2+ distal pulses.    Pertinent Labs/Studies:   Lab Results  Component Value Date    WBC 6.5 07/15/2023   HGB 11.7 (L) 07/15/2023   HCT 36.0 07/15/2023   MCV 93.0 07/15/2023   PLT 167 07/15/2023    Assessment : Alyssa Morgan is a 27 y.o. G3P1011 at [redacted]w[redacted]d being admitted for cesarean section secondary to elective repeat. She also desires BTL  Plan: The risks of cesarean section discussed with the patient included but were not limited to: bleeding which may require transfusion or reoperation; infection which may  require antibiotics; injury to bowel, bladder, ureters or other surrounding organs; injury to the fetus; need for additional procedures including hysterectomy in the event of a life-threatening hemorrhage; placental abnormalities wth subsequent pregnancies, incisional problems, thromboembolic phenomenon and other postoperative/anesthesia complications. The patient concurred with the proposed plan, giving informed written consent for the procedure.   Patient has been NPO since last night and will remain NPO for procedure.  Preoperative prophylactic Ancef ordered on call to the OR.    Levie Heritage, DO 07/15/2023, 8:44 AM

## 2023-07-16 ENCOUNTER — Other Ambulatory Visit (HOSPITAL_COMMUNITY): Payer: Self-pay

## 2023-07-16 LAB — GLUCOSE, CAPILLARY: Glucose-Capillary: 153 mg/dL — ABNORMAL HIGH (ref 70–99)

## 2023-07-16 MED ORDER — SIMETHICONE 80 MG PO CHEW
80.0000 mg | CHEWABLE_TABLET | Freq: Two times a day (BID) | ORAL | 0 refills | Status: DC
  Filled 2023-07-16: qty 20, 10d supply, fill #0

## 2023-07-16 MED ORDER — FUROSEMIDE 20 MG PO TABS
20.0000 mg | ORAL_TABLET | Freq: Two times a day (BID) | ORAL | 0 refills | Status: DC
  Filled 2023-07-16: qty 6, 3d supply, fill #0

## 2023-07-16 MED ORDER — IBUPROFEN 600 MG PO TABS
600.0000 mg | ORAL_TABLET | Freq: Four times a day (QID) | ORAL | 0 refills | Status: DC | PRN
  Filled 2023-07-16: qty 30, 8d supply, fill #0

## 2023-07-16 MED ORDER — FERROUS SULFATE 325 (65 FE) MG PO TABS
325.0000 mg | ORAL_TABLET | ORAL | Status: DC
  Administered 2023-07-16: 325 mg via ORAL
  Filled 2023-07-16: qty 1

## 2023-07-16 MED ORDER — FERROUS SULFATE 325 (65 FE) MG PO TABS
325.0000 mg | ORAL_TABLET | ORAL | 0 refills | Status: DC
  Filled 2023-07-16: qty 42, 84d supply, fill #0

## 2023-07-16 MED ORDER — NIFEDIPINE ER 30 MG PO TB24
30.0000 mg | ORAL_TABLET | Freq: Every day | ORAL | 0 refills | Status: DC
  Filled 2023-07-16: qty 42, 42d supply, fill #0

## 2023-07-16 MED ORDER — SENNOSIDES-DOCUSATE SODIUM 8.6-50 MG PO TABS
1.0000 | ORAL_TABLET | Freq: Every day | ORAL | 0 refills | Status: AC
Start: 2023-07-16 — End: 2023-07-18
  Filled 2023-07-16: qty 2, 2d supply, fill #0

## 2023-07-16 NOTE — Plan of Care (Signed)
Adequate for discharge.

## 2023-07-16 NOTE — Progress Notes (Signed)
Circumcision Consent  Discussed with mom at bedside about circumcision.   Circumcision is a surgery that removes the skin that covers the tip of the penis, called the "foreskin." Circumcision is usually done when a boy is between 26 and 68 days old, sometimes up to 5-98 weeks old.  The most common reasons boys are circumcised include for cultural/religious beliefs or for parental preference (potentially easier to clean, so baby looks like daddy, etc).  There may be some medical benefits for circumcision:   Circumcised boys seem to have slightly lower rates of: ? Urinary tract infections (per the American Academy of Pediatrics an uncircumcised boy has a 1/100 chance of developing a UTI in the first year of life, a circumcised boy at a 12/998 chance of developing a UTI in the first year of life- a 10% reduction) ? Penis cancer (typically rare- an uncircumcised female has a 1 in 100,000 chance of developing cancer of the penis) ? Sexually transmitted infection (in endemic areas, including HIV, HPV and Herpes- circumcision does NOT protect against gonorrhea, chlamydia, trachomatis, or syphilis) ? Phimosis: a condition where that makes retraction of the foreskin over the glans impossible (0.4 per 1000 boys per year or 0.6% of boys are affected by their 15th birthday)  Boys and men who are not circumcised can reduce these extra risks by: ? Cleaning their penis well ? Using condoms during sex  What are the risks of circumcision?  As with any surgical procedure, there are risks and complications. In circumcision, complications are rare and usually minor, the most common being: ? Bleeding- risk is reduced by holding each clamp for 30 seconds prior to a cut being made, and by holding pressure after the procedure is done ? Infection- the penis is cleaned prior to the procedure, and the procedure is done under sterile technique ? Damage to the urethra or amputation of the penis  How is circumcision done  in baby boys?  The baby will be placed on a special table and the legs restrained for their safety. Numbing medication is injected into the penis, and the skin is cleansed with betadine to decrease the risk of infection.   What to expect:  The penis will look red and raw for 5-7 days as it heals. We expect scabbing around where the cut was made, as well as clear-pink fluid and some swelling of the penis right after the procedure. If your baby's circumcision starts to bleed or develops pus, please contact your pediatrician immediately.  All questions were answered and mother consented.  Alyssa Living, MD 7:55 AM

## 2023-07-16 NOTE — Progress Notes (Signed)
POSTPARTUM PROGRESS NOTE  POD #1  Subjective:  Alyssa Morgan is a 27 y.o. [id number] s/p rLTCS at [redacted]w[redacted]d.  She reports she doing well. No acute events overnight. She reports she is doing well. She denies any problems with ambulating, voiding or po intake. Denies nausea or vomiting. She has not passed flatus. Pain is moderately controlled.  Lochia is appropriate.  Objective: Blood pressure 120/78, pulse 82, temperature (!) 97.5 F (36.4 C), temperature source Oral, resp. rate 16, height 5\' 3"  (1.6 m), weight 129 kg, SpO2 98%, unknown if currently breastfeeding.  Physical Exam:  General: alert, cooperative and no distress Chest: no respiratory distress Heart:regular rate, distal pulses intact Abdomen: soft, nontender,  Uterine Fundus: firm, appropriately tender DVT Evaluation: No calf swelling or tenderness Extremities: trace edema Skin: warm, dry; incision clean/dry/intact w/ honeycomb dressing in place  Recent Labs    07/15/23 0628 07/16/23 0427  HGB 11.7* 10.4*  HCT 36.0 31.5*    Assessment/Plan: Alyssa Fleig is a 27 y.o. [id number] s/p rLTCS at [redacted]w[redacted]d for elective repeat.  POD#1 - Doing welll; pain moderately controlled. H/H appropriate  Routine postpartum care  OOB, ambulated  Lovenox for VTE prophylaxis Anemia: asymptomatic  Start po ferrous sulfate BID  Contraception: BTL Feeding: breast  Dispo: Plan for discharge tomorrow .   LOS: 1 day   Derrel Nip, MD  OB Fellow  07/16/2023, 7:55 AM

## 2023-07-17 ENCOUNTER — Other Ambulatory Visit: Payer: Medicaid Other

## 2023-07-18 ENCOUNTER — Encounter (HOSPITAL_COMMUNITY)
Admission: RE | Admit: 2023-07-18 | Discharge: 2023-07-18 | Disposition: A | Discharge status: Home / Self Care | Payer: Medicaid Other | Source: Ambulatory Visit | Attending: Obstetrics & Gynecology | Admitting: Obstetrics & Gynecology

## 2023-07-18 HISTORY — DX: Gestational diabetes mellitus in pregnancy, unspecified control: O24.419

## 2023-07-21 ENCOUNTER — Encounter (HOSPITAL_COMMUNITY): Payer: Medicaid Other

## 2023-07-21 ENCOUNTER — Encounter: Payer: Medicaid Other | Admitting: Advanced Practice Midwife

## 2023-07-21 ENCOUNTER — Other Ambulatory Visit: Payer: Medicaid Other

## 2023-07-21 DIAGNOSIS — O24415 Gestational diabetes mellitus in pregnancy, controlled by oral hypoglycemic drugs: Secondary | ICD-10-CM

## 2023-07-21 DIAGNOSIS — Z98891 History of uterine scar from previous surgery: Secondary | ICD-10-CM

## 2023-07-21 DIAGNOSIS — O0990 Supervision of high risk pregnancy, unspecified, unspecified trimester: Secondary | ICD-10-CM

## 2023-07-23 ENCOUNTER — Encounter: Payer: Self-pay | Admitting: Obstetrics & Gynecology

## 2023-07-23 ENCOUNTER — Ambulatory Visit (INDEPENDENT_AMBULATORY_CARE_PROVIDER_SITE_OTHER): Payer: Medicaid Other | Admitting: Obstetrics & Gynecology

## 2023-07-23 VITALS — BP 158/98 | HR 74 | Ht 63.0 in | Wt 268.0 lb

## 2023-07-23 DIAGNOSIS — Z09 Encounter for follow-up examination after completed treatment for conditions other than malignant neoplasm: Secondary | ICD-10-CM

## 2023-07-23 DIAGNOSIS — O133 Gestational [pregnancy-induced] hypertension without significant proteinuria, third trimester: Secondary | ICD-10-CM

## 2023-07-23 MED ORDER — FUROSEMIDE 20 MG PO TABS
20.0000 mg | ORAL_TABLET | Freq: Every day | ORAL | 0 refills | Status: DC
Start: 2023-07-23 — End: 2023-08-27

## 2023-07-23 MED ORDER — NIFEDIPINE ER OSMOTIC RELEASE 30 MG PO TB24: 30.0000 mg | ORAL_TABLET | Freq: Every day | ORAL | 0 refills | Status: DC

## 2023-07-23 MED ORDER — IBUPROFEN 600 MG PO TABS: 600.0000 mg | ORAL_TABLET | Freq: Four times a day (QID) | ORAL | 1 refills | Status: DC | PRN

## 2023-07-23 NOTE — Progress Notes (Signed)
    PostOp Visit Note  Alyssa Morgan is a 27 y.o. G3P2012 female who presents for a postoperative visit. She is 1 week postop following a repeat C-section completed on 8/13   Today she notes that she has been doing ok.  Pain controlled with tylenol, she did not get any other prescriptions. Denies fever or chills.  Tolerating gen diet.  +Flatus, Regular BMs.    GestHTN-patient was supposed to be taking Procardia 30 mg daily and Lasix for 3 days; however, due to transfer of the baby she did not receive these prescriptions.  Review of Systems Denies headache, blurry vision, or right upper quadrant pain.  She notes some minimal lower extremity edema.  Objective:  BP (!) 153/97 (BP Location: Right Arm, Patient Position: Sitting, Cuff Size: Large)   Pulse 79   Ht 5\' 3"  (1.6 m)   Wt 268 lb (121.6 kg)   LMP  (LMP Unknown) Comment: Pt has irregular periods.  Breastfeeding No   BMI 47.47 kg/m    Physical Examination:  GENERAL ASSESSMENT: well developed and well nourished SKIN: normal color, no lesions CHEST: normal air exchange, respiratory effort normal with no retractions HEART: regular rate and rhythm ABDOMEN: Obese, soft, nondistended, no rebound no guarding INCISION: Honeycomb dressing still in place.  Dressing removed with peeling Dermabond which was also removed.  Incision appears clean and intact.  Slight wetness and odor noted likely due to old dressing. EXTREMITY: 1+ edema, no calf tenderness bilaterally PSYCH: mood appropriate, normal affect       Assessment:   Postop incision check Gestational hypertension Obesity  Plan:   Prescription for Procardia and Lasix sent in Reviewed incision care Follow-up in 1 week  Myna Hidalgo, DO Attending Obstetrician & Gynecologist, Faculty Practice Center for [facility], [facility]

## 2023-07-24 ENCOUNTER — Other Ambulatory Visit: Payer: Medicaid Other

## 2023-07-30 ENCOUNTER — Ambulatory Visit: Payer: Medicaid Other | Admitting: Advanced Practice Midwife

## 2023-07-30 ENCOUNTER — Ambulatory Visit (INDEPENDENT_AMBULATORY_CARE_PROVIDER_SITE_OTHER): Payer: Medicaid Other | Admitting: Advanced Practice Midwife

## 2023-07-30 ENCOUNTER — Encounter: Payer: Self-pay | Admitting: Advanced Practice Midwife

## 2023-07-30 VITALS — BP 117/77 | HR 112 | Ht 64.0 in | Wt 261.0 lb

## 2023-07-30 DIAGNOSIS — Z09 Encounter for follow-up examination after completed treatment for conditions other than malignant neoplasm: Secondary | ICD-10-CM | POA: Diagnosis not present

## 2023-07-30 NOTE — Patient Instructions (Addendum)
Stop your blood pressure medicine on 9/22; as long as your blood pressure is good on 9/25 at your visit, then you can stop it completely. Use the website www.postpartum.net for any postpartum concerns, including postpartum depression.

## 2023-07-30 NOTE — Progress Notes (Signed)
   GYN VISIT Patient name: Alyssa Morgan MRN [id number]  Date of birth: 1996/04/04 Chief Complaint:   Postpartum Care (1 wk follow-up blood pressure , check incision again)  History of Present Illness:   Alyssa Bero is a 27 y.o. G27P2012 African-American female being seen today for 2wk post-op incision check; BP check s/p gHTN. Doing well; takes occ pain meds for incisional discomfort. No LMP recorded (lmp unknown). The current method of family planning is abstinence.  Last pap Aug 2022. Results were: NILM w/ HRHPV not done     05/13/2023    3:28 PM  Depression screen PHQ 2/9  Decreased Interest 2  Down, Depressed, Hopeless 2  PHQ - 2 Score 4  Altered sleeping 3  Tired, decreased energy 3  Change in appetite 0  Feeling bad or failure about yourself  3  Trouble concentrating 0  Moving slowly or fidgety/restless 2  Suicidal thoughts 2  PHQ-9 Score 17        05/13/2023    3:28 PM  GAD 7 : Generalized Anxiety Score  Nervous, Anxious, on Edge 3  Control/stop worrying 3  Worry too much - different things 3  Trouble relaxing 3  Restless 3  Easily annoyed or irritable 3  Afraid - awful might happen 3  Total GAD 7 Score 21     Review of Systems:   Pertinent items are noted in HPI Denies fever/chills, dizziness, headaches, visual disturbances, fatigue, shortness of breath, chest pain, abdominal pain, vomiting, abnormal vaginal discharge/itching/odor/irritation, problems with periods, bowel movements, urination, or intercourse unless otherwise stated above.  Pertinent History Reviewed:  Reviewed past medical,surgical, social, obstetrical and family history.  Reviewed problem list, medications and allergies. Physical Assessment:   Vitals:   07/30/23 1034  BP: 117/77  Pulse: (!) 112  Weight: 261 lb (118.4 kg)  Height: 5\' 4"  (1.626 m)  Body mass index is 44.8 kg/m.       Physical Examination:   General appearance: alert, well appearing, and in no distress  Mental status:  alert, oriented to person, place, and time  Skin: warm & dry   Cardiovascular: normal heart rate noted  Respiratory: normal respiratory effort, no distress  Abdomen: soft, approp tender; LTCS incision well-approximated without erythema or drainage   Pelvic: examination not indicated  Extremities: no edema    No results found for this or any previous visit (from the past 24 hour(s)).  Assessment & Plan:  1) s/p rLTCS x 2wk ago> normal healing process  2) Hx gHTN> BP stable today on ProcardiaXL 30mg ; stop on 9/22 and will reeval BP at 9/25 visit  3) Hx uncontrolled GDMA2> GTT at PP visit  Meds: No orders of the defined types were placed in this encounter.   No orders of the defined types were placed in this encounter.   Return for As scheduled (with GTT).   Arabella Merles CNM 07/30/2023 10:54 AM

## 2023-08-27 ENCOUNTER — Ambulatory Visit: Payer: Medicaid Other | Admitting: Women's Health

## 2023-08-27 ENCOUNTER — Other Ambulatory Visit: Payer: Medicaid Other

## 2023-08-27 ENCOUNTER — Encounter: Payer: Self-pay | Admitting: Women's Health

## 2023-08-27 DIAGNOSIS — Z131 Encounter for screening for diabetes mellitus: Secondary | ICD-10-CM

## 2023-08-27 DIAGNOSIS — F418 Other specified anxiety disorders: Secondary | ICD-10-CM

## 2023-08-27 DIAGNOSIS — Z8632 Personal history of gestational diabetes: Secondary | ICD-10-CM

## 2023-08-27 DIAGNOSIS — Z98891 History of uterine scar from previous surgery: Secondary | ICD-10-CM

## 2023-08-27 NOTE — Patient Instructions (Signed)
[facility]  [address] [place], [place] [address] Phone: [phone] Hours: Mon-Fri 8AM-5PM  If you're a walk-in patient there is generally a wait time involved on a "first come, first served basis" otherwise contact us beforehand to setup an appointment. If you're in crisis please use our 24-Hour Crisis Hotline for immediate access to a clinician. 24 Hour Crisis Line: [phone]   If this is your first time please bring the following with you: Insurance/Medicaid Card ID or Social Security Card Any referral documentation  Payment Options Individuals with Medicaid have no obligation for a copay. Individuals with Medicare or private insurance will be obligated to meet their policy's requirement(s). Individuals who are uninsured will be eligible for a sliding or discounted scaled as defined by the relevant Managed Care Organization/Local Management Entity  Services:  Substance Abuse Outpatient Treatment Mental Health Outpatient Treatment Psychiatric Services/ Medical Services Advanced Access/Walk-In Clinic Mobile Crisis Management Services ACTT (Assertive Community Treatment Team) Dialectic Behavioral Therapy Critical Time Intervention Psychosocial Rehabilitation [facility]) Medication Assisted Treatment

## 2023-08-27 NOTE — Progress Notes (Signed)
POSTPARTUM VISIT Patient name: Alyssa Morgan MRN [id number]  Date of birth: [birth year] Chief Complaint:   Postpartum Care  History of Present Illness:   Alyssa Laramee is a 27 y.o. [id number] African American female being seen today for a postpartum visit. She is 6 weeks postpartum following a repeat cesarean section, low transverse incision & BTS at 38.3 gestational weeks. IOL: no, for n/a. Anesthesia: spinal.  Laceration: n/a.  Complications: none. Inpatient contraception: yes BTS .   Pregnancy complicated by A2DM, GHTN . Tobacco use: former . Substance use disorder: no. Last pap smear: 08/01/21 and results were NILM w/ HRHPV not done. Next pap smear due: 2025 No LMP recorded (lmp unknown).  Postpartum course has been uncomplicated. Bleeding none. Bowel function is normal. Bladder function is normal. Urinary incontinence? no, fecal incontinence? no Patient is sexually active. Last sexual activity:  did not discuss . Desired contraception: s/p BTS. Patient does not want a pregnancy in the future.  Desired family size is 2 children.   Upstream - 08/27/23 5956       Pregnancy Intention Screening   Does the patient want to become pregnant in the next year? No    Does the patient's partner want to become pregnant in the next year? No    Would the patient like to discuss contraceptive options today? No      Contraception Wrap Up   Current Method Female Sterilization    End Method Female Sterilization    Contraception Counseling Provided No            The pregnancy intention screening data noted above was reviewed. Potential methods of contraception were discussed. The patient elected to proceed with Female Sterilization.  Edinburgh Postpartum Depression Screening: positive: H/O mental health disorder: yes dep/anx- was on zoloft and another 'a' medicine in past- stopped before pregnancy, both were rx'd by [facility]. Currently on meds: no.  Currently in therapy: no.  Sleeping: not well.   Appetite: ok.  Still finds joy in things she used to: Yes.  Support at home: 'ok'.  SI/HI/II: no.  Interested in medicine: No.  Interested in therapy: No.  Edinburgh Postnatal Depression Scale - 08/27/23 0938       Edinburgh Postnatal Depression Scale:  In the Past 7 Days   I have been able to laugh and see the funny side of things. 1    I have looked forward with enjoyment to things. 1    I have blamed myself unnecessarily when things went wrong. 3    I have been anxious or worried for no good reason. 2    I have felt scared or panicky for no good reason. 2    Things have been getting on top of me. 2    I have been so unhappy that I have had difficulty sleeping. 2    I have felt sad or miserable. 1    I have been so unhappy that I have been crying. 1    The thought of harming myself has occurred to me. 0    Edinburgh Postnatal Depression Scale Total 15                05/13/2023    3:28 PM  GAD 7 : Generalized Anxiety Score  Nervous, Anxious, on Edge 3  Control/stop worrying 3  Worry too much - different things 3  Trouble relaxing 3  Restless 3  Easily annoyed or irritable 3  Afraid - awful might happen 3  Total  GAD 7 Score 21     Baby's course has been uncomplicated. Baby is feeding by bottle. Infant has a pediatrician/family doctor? Yes.  Childcare strategy if returning to work/school: n/a-stay at home mom.  Pt has material needs met for her and baby: Yes.   Review of Systems:   Pertinent items are noted in HPI Denies Abnormal vaginal discharge w/ itching/odor/irritation, headaches, visual changes, shortness of breath, chest pain, abdominal pain, severe nausea/vomiting, or problems with urination or bowel movements. Pertinent History Reviewed:  Reviewed past medical,surgical, obstetrical and family history.  Reviewed problem list, medications and allergies. OB History  Gravida Para Term Preterm AB Living  3 2 2   1 2   SAB IAB Ectopic Multiple Live Births    1   0 2     # Outcome Date GA Lbr Len/2nd Weight Sex Type Anes PTL Lv  3 Term 07/15/23 [redacted]w[redacted]d  7 lb 10.8 oz (3.48 kg) M CS-LTranv Spinal  LIV  2 Term 07/22/18 [redacted]w[redacted]d  7 lb (3.175 kg) F CS-LTranv EPI N LIV     Complications: Failure to Progress in First Stage, Gestational diabetes  1 IAB              Birth Comments: semi lobar holoprosencephaly   Physical Assessment:   Vitals:   08/27/23 0936  BP: 123/75  Pulse: 81  Weight: 271 lb (122.9 kg)  Height: 5\' 4"  (1.626 m)  Body mass index is 46.52 kg/m.       Physical Examination:   General appearance: alert, well appearing, and in no distress  Mental status: alert, oriented to person, place, and time  Skin: warm & dry   Cardiovascular: normal heart rate noted   Respiratory: normal respiratory effort, no distress   Breasts: deferred, no complaints   Abdomen: soft, non-tender, c/s incision healing well, no s/s infection  Pelvic: examination not indicated. Thin prep pap obtained: No  Rectal: not examined  Extremities: Edema: none   Chaperone: N/A         No results found for this or any previous visit (from the past 24 hour(s)).  Assessment & Plan:  1) Postpartum exam 2) 6 wks s/p repeat cesarean section, low transverse incision 3) bottle feeding 4) Depression screening 5) Contraception s/p BTS 6) Dep/anx> no meds, declines meds today, to make appt to f/u w/ [facility] (who she saw previously for mental health management), printed info given for [facility] 7) A2DM> doing GTT today  Essential components of care per ACOG recommendations:  1.  Mood and well being:  If positive depression screen, discussed and plan developed.  If using tobacco we discussed reduction/cessation and risk of relapse If current substance abuse, we discussed and referral to local resources was offered.   2. Infant care and feeding:  If breastfeeding, discussed returning to work, pumping, breastfeeding-associated pain, guidance regarding return to fertility while  lactating if not using another method. If needed, patient was provided with a letter to be allowed to pump q 2-3hrs to support lactation in a private location with access to a refrigerator to store breastmilk.   Recommended that all caregivers be immunized for flu, pertussis and other preventable communicable diseases If pt does not have material needs met for her/baby, referred to local resources for help obtaining these.  3. Sexuality, contraception and birth spacing Provided guidance regarding sexuality, management of dyspareunia, and resumption of intercourse Discussed avoiding interpregnancy interval <14mths and recommended birth spacing of 18 months  4. Sleep and fatigue  Discussed coping options for fatigue and sleep disruption Encouraged family/partner/community support of 4 hrs of uninterrupted sleep to help with mood and fatigue  5. Physical recovery  If pt had a C/S, assessed incisional pain and providing guidance on normal vs prolonged recovery If pt had a laceration, perineal healing and pain reviewed.  If urinary or fecal incontinence, discussed management and referred to PT or uro/gyn if indicated  Patient is safe to resume physical activity. Discussed attainment of healthy weight.  6.  Chronic disease management Discussed pregnancy complications if any, and their implications for future childbearing and long-term maternal health. Review recommendations for prevention of recurrent pregnancy complications, such as 17 hydroxyprogesterone caproate to reduce risk for recurrent PTB not applicable, or aspirin to reduce risk of preeclampsia not applicable. Pt had GDM: yes. If yes, 2hr GTT scheduled: yes. Reviewed medications and non-pregnant dosing including consideration of whether pt is breastfeeding using a reliable resource such as LactMed: not applicable Referred for f/u w/ PCP or subspecialist providers as indicated: yes, list of local PCP given (currently doesn't have one)  7.  Health maintenance Mammogram at 28yo or earlier if indicated Pap smears as indicated  Meds: No orders of the defined types were placed in this encounter.   Follow-up: Return in about 1 year (around 08/26/2024) for Pap & physical.   No orders of the defined types were placed in this encounter.   Cheral Marker CNM, [facility] 08/27/2023 10:02 AM

## 2023-08-28 ENCOUNTER — Other Ambulatory Visit: Payer: Self-pay | Admitting: Women's Health

## 2023-08-28 DIAGNOSIS — R7302 Impaired glucose tolerance (oral): Secondary | ICD-10-CM

## 2023-08-28 DIAGNOSIS — Z8632 Personal history of gestational diabetes: Secondary | ICD-10-CM

## 2023-08-28 LAB — GLUCOSE TOLERANCE, 2 HOURS W/ 1HR
Glucose, 1 hour: 171 mg/dL (ref 70–179)
Glucose, 2 hour: 150 mg/dL (ref 70–152)
Glucose, Fasting: 101 mg/dL — ABNORMAL HIGH (ref 70–91)

## 2023-09-24 ENCOUNTER — Encounter: Payer: Self-pay | Admitting: Advanced Practice Midwife

## 2023-09-24 ENCOUNTER — Ambulatory Visit (INDEPENDENT_AMBULATORY_CARE_PROVIDER_SITE_OTHER): Payer: Medicaid Other | Admitting: Advanced Practice Midwife

## 2023-09-24 VITALS — BP 161/111 | HR 92 | Ht 64.0 in | Wt 270.0 lb

## 2023-09-24 DIAGNOSIS — N61 Mastitis without abscess: Secondary | ICD-10-CM | POA: Diagnosis not present

## 2023-09-24 MED ORDER — DICLOXACILLIN SODIUM 500 MG PO CAPS: 500.0000 mg | ORAL_CAPSULE | Freq: Four times a day (QID) | ORAL | 0 refills | Status: AC

## 2023-09-24 NOTE — Progress Notes (Signed)
GYN VISIT Patient name: Alyssa Morgan MRN [id number]  Date of birth: Aug 28, 1996 Chief Complaint:   Left breast pain (Pain since last week, stopped breastfeeding in August)  History of Present Illness:   Alyssa Morgan is a 27 y.o. [phone] African-American female being seen today for onset of left breast nodule x 1 week ago; she delivered her son 07/15/23 and breastfed x a few days prior to stopping; denies fever or body aches.    Patient's last menstrual period was 09/10/2023. The current method of family planning is tubal ligation.  Last pap Aug 2022. Results were: NILM w/ HRHPV not done     05/13/2023    3:28 PM  Depression screen PHQ 2/9  Decreased Interest 2  Down, Depressed, Hopeless 2  PHQ - 2 Score 4  Altered sleeping 3  Tired, decreased energy 3  Change in appetite 0  Feeling bad or failure about yourself  3  Trouble concentrating 0  Moving slowly or fidgety/restless 2  Suicidal thoughts 2  PHQ-9 Score 17        05/13/2023    3:28 PM  GAD 7 : Generalized Anxiety Score  Nervous, Anxious, on Edge 3  Control/stop worrying 3  Worry too much - different things 3  Trouble relaxing 3  Restless 3  Easily annoyed or irritable 3  Afraid - awful might happen 3  Total GAD 7 Score 21     Review of Systems:   Pertinent items are noted in HPI Denies fever/chills, dizziness, headaches, visual disturbances, fatigue, shortness of breath, chest pain, abdominal pain, vomiting, abnormal vaginal discharge/itching/odor/irritation, problems with periods, bowel movements, urination, or intercourse unless otherwise stated above.  Pertinent History Reviewed:  Reviewed past medical,surgical, social, obstetrical and family history.  Reviewed problem list, medications and allergies. Physical Assessment:   Vitals:   09/24/23 1210 09/24/23 1217  BP: (!) 147/109 (!) 161/111  Pulse: 95 92  Weight: 270 lb (122.5 kg)   Height: 5\' 4"  (1.626 m)   Body mass index is 46.35 kg/m.        Physical Examination:   General appearance: alert, well appearing, and in no distress  Mental status: alert, oriented to person, place, and time  Breasts: left> quadrant on areola from 9 o'clock to 12 o'clock firm, slightly tender and very firm approx 3.5cm in size; skin with slight erythema  Skin: warm & dry   Cardiovascular: normal heart rate noted  Respiratory: normal respiratory effort, no distress  Abdomen: soft, non-tender   Pelvic: examination not indicated  Extremities: no edema    No results found for this or any previous visit (from the past 24 hour(s)).  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    Dispense:  40 capsule    Refill:  0    Order Specific Question:   Supervising Provider  AnswerMyna Hidalgo [[id number]]    No orders of the defined types were placed in this encounter.   Return for 2wk follow up (mastitis).  Arabella Merles CNM 09/24/2023 12:38 PM

## 2023-10-08 ENCOUNTER — Encounter: Payer: Medicaid Other | Admitting: Women's Health

## 2024-01-07 ENCOUNTER — Telehealth: Payer: Self-pay | Admitting: *Deleted

## 2024-01-07 NOTE — Telephone Encounter (Signed)
 Received refill request for procardia . Sent pt message to follow up with pcp.

## 2024-01-08 ENCOUNTER — Other Ambulatory Visit: Payer: Self-pay | Admitting: Obstetrics & Gynecology

## 2024-01-08 DIAGNOSIS — O133 Gestational [pregnancy-induced] hypertension without significant proteinuria, third trimester: Secondary | ICD-10-CM

## 2024-05-20 ENCOUNTER — Ambulatory Visit

## 2024-07-23 ENCOUNTER — Encounter: Payer: Self-pay | Admitting: Radiology

## 2024-08-09 ENCOUNTER — Ambulatory Visit: Payer: Self-pay

## 2024-08-09 VITALS — BP 135/88 | HR 97 | Ht 64.0 in | Wt 266.0 lb

## 2024-08-09 DIAGNOSIS — Z6841 Body Mass Index (BMI) 40.0 and over, adult: Secondary | ICD-10-CM | POA: Diagnosis not present

## 2024-08-09 DIAGNOSIS — I158 Other secondary hypertension: Secondary | ICD-10-CM

## 2024-08-09 DIAGNOSIS — G43709 Chronic migraine without aura, not intractable, without status migrainosus: Secondary | ICD-10-CM | POA: Diagnosis not present

## 2024-08-09 DIAGNOSIS — O133 Gestational [pregnancy-induced] hypertension without significant proteinuria, third trimester: Secondary | ICD-10-CM

## 2024-08-09 DIAGNOSIS — Z23 Encounter for immunization: Secondary | ICD-10-CM | POA: Diagnosis not present

## 2024-08-09 MED ORDER — IBUPROFEN 600 MG PO TABS
600.0000 mg | ORAL_TABLET | Freq: Four times a day (QID) | ORAL | 3 refills | Status: AC | PRN
Start: 2024-08-09 — End: ?

## 2024-08-09 MED ORDER — NIFEDIPINE ER OSMOTIC RELEASE 30 MG PO TB24
30.0000 mg | ORAL_TABLET | Freq: Every day | ORAL | 3 refills | Status: AC
Start: 2024-08-09 — End: 2025-08-09
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
## 2024-08-10 LAB — CBC
Hematocrit: 37.5 % (ref 34.0–46.6)
Hemoglobin: 12.3 g/dL (ref 11.1–15.9)
MCH: 31.1 pg (ref 26.6–33.0)
MCHC: 32.8 g/dL (ref 31.5–35.7)
MCV: 95 fL (ref 79–97)
Platelets: 269 x10E3/uL (ref 150–450)
RBC: 3.96 x10E6/uL (ref 3.77–5.28)
RDW: 13.2 % (ref 11.7–15.4)
WBC: 8.2 x10E3/uL (ref 3.4–10.8)

## 2024-08-10 LAB — BASIC METABOLIC PANEL WITH GFR
BUN/Creatinine Ratio: 13 (ref 9–23)
BUN: 9 mg/dL (ref 6–20)
CO2: 21 mmol/L (ref 20–29)
Calcium: 8.9 mg/dL (ref 8.7–10.2)
Chloride: 105 mmol/L (ref 96–106)
Creatinine, Ser: 0.68 mg/dL (ref 0.57–1.00)
Glucose: 142 mg/dL — ABNORMAL HIGH (ref 70–99)
Potassium: 4.2 mmol/L (ref 3.5–5.2)
Sodium: 140 mmol/L (ref 134–144)
eGFR: 121 mL/min/1.73 (ref >59)

## 2024-08-11 ENCOUNTER — Ambulatory Visit: Payer: Self-pay

## 2024-08-11 DIAGNOSIS — G43709 Chronic migraine without aura, not intractable, without status migrainosus: Secondary | ICD-10-CM | POA: Insufficient documentation

## 2024-08-11 DIAGNOSIS — Z6841 Body Mass Index (BMI) 40.0 and over, adult: Secondary | ICD-10-CM | POA: Insufficient documentation

## 2024-08-11 DIAGNOSIS — I158 Other secondary hypertension: Secondary | ICD-10-CM | POA: Insufficient documentation

## 2024-08-11 NOTE — Assessment & Plan Note (Signed)
 Ibuprofen  refilled for as needed relief of chronic migraines.  Recommend updating eye exam, as she states her current prescription is a few years old

## 2024-08-11 NOTE — Assessment & Plan Note (Signed)
 Stable with current medications.  No medication changes made today nifedipine  30 mg refilled.  Recommend weight loss with healthy diet and regular exercise in addition to low-sodium diet.

## 2024-08-11 NOTE — Assessment & Plan Note (Signed)
 Recommend working on weight loss with healthy diet and regular exercise, aiming for 150 minutes/week.

## 2024-10-04 ENCOUNTER — Encounter: Payer: Self-pay | Admitting: Radiology

## 2025-02-07 ENCOUNTER — Ambulatory Visit
